# Patient Record
Sex: Male | Born: 1960 | Race: White | Hispanic: No | Marital: Married | Smoking: Former smoker
Health system: Southern US, Community
[De-identification: ages and names within clinical notes are randomized; demographics above are authoritative.]

## PROBLEM LIST (undated history)

## (undated) DIAGNOSIS — R519 Headache, unspecified: Secondary | ICD-10-CM

## (undated) HISTORY — PX: ROOT CANAL: SHX2363

## (undated) HISTORY — DX: Headache, unspecified: R51.9

## (undated) HISTORY — PX: TOOTH EXTRACTION: SUR596

---

## 1999-02-17 ENCOUNTER — Ambulatory Visit (HOSPITAL_BASED_OUTPATIENT_CLINIC_OR_DEPARTMENT_OTHER): Admission: RE | Admit: 1999-02-17 | Discharge: 1999-02-17 | Payer: Self-pay | Admitting: General Surgery

## 2018-12-12 ENCOUNTER — Other Ambulatory Visit: Payer: Self-pay

## 2018-12-12 DIAGNOSIS — Z20822 Contact with and (suspected) exposure to covid-19: Secondary | ICD-10-CM

## 2018-12-13 LAB — NOVEL CORONAVIRUS, NAA: SARS-CoV-2, NAA: NOT DETECTED

## 2018-12-17 ENCOUNTER — Telehealth: Payer: Self-pay | Admitting: General Practice

## 2018-12-17 NOTE — Telephone Encounter (Signed)
Pt aware covid lab test negative, not detected °

## 2019-10-22 DIAGNOSIS — E6609 Other obesity due to excess calories: Secondary | ICD-10-CM | POA: Diagnosis not present

## 2019-10-22 DIAGNOSIS — Z0000 Encounter for general adult medical examination without abnormal findings: Secondary | ICD-10-CM | POA: Diagnosis not present

## 2019-10-22 DIAGNOSIS — Z6832 Body mass index (BMI) 32.0-32.9, adult: Secondary | ICD-10-CM | POA: Diagnosis not present

## 2019-10-22 DIAGNOSIS — Z1389 Encounter for screening for other disorder: Secondary | ICD-10-CM | POA: Diagnosis not present

## 2019-10-22 DIAGNOSIS — Q431 Hirschsprung's disease: Secondary | ICD-10-CM | POA: Diagnosis not present

## 2019-12-03 DIAGNOSIS — R972 Elevated prostate specific antigen [PSA]: Secondary | ICD-10-CM | POA: Diagnosis not present

## 2019-12-03 DIAGNOSIS — Z125 Encounter for screening for malignant neoplasm of prostate: Secondary | ICD-10-CM | POA: Diagnosis not present

## 2019-12-03 DIAGNOSIS — N400 Enlarged prostate without lower urinary tract symptoms: Secondary | ICD-10-CM | POA: Diagnosis not present

## 2020-01-09 DIAGNOSIS — R972 Elevated prostate specific antigen [PSA]: Secondary | ICD-10-CM | POA: Diagnosis not present

## 2020-01-09 DIAGNOSIS — C61 Malignant neoplasm of prostate: Secondary | ICD-10-CM | POA: Diagnosis not present

## 2020-01-15 DIAGNOSIS — R972 Elevated prostate specific antigen [PSA]: Secondary | ICD-10-CM | POA: Diagnosis not present

## 2020-01-15 DIAGNOSIS — C61 Malignant neoplasm of prostate: Secondary | ICD-10-CM | POA: Diagnosis not present

## 2020-01-15 DIAGNOSIS — N5201 Erectile dysfunction due to arterial insufficiency: Secondary | ICD-10-CM | POA: Diagnosis not present

## 2020-01-15 DIAGNOSIS — N400 Enlarged prostate without lower urinary tract symptoms: Secondary | ICD-10-CM | POA: Diagnosis not present

## 2020-01-23 ENCOUNTER — Other Ambulatory Visit: Payer: Self-pay | Admitting: Urology

## 2020-01-23 DIAGNOSIS — C61 Malignant neoplasm of prostate: Secondary | ICD-10-CM

## 2020-02-17 ENCOUNTER — Ambulatory Visit
Admission: RE | Admit: 2020-02-17 | Discharge: 2020-02-17 | Disposition: A | Discharge status: Home / Self Care | Payer: BC Managed Care – PPO | Source: Ambulatory Visit | Attending: Urology | Admitting: Urology

## 2020-02-17 DIAGNOSIS — C61 Malignant neoplasm of prostate: Secondary | ICD-10-CM

## 2020-02-17 DIAGNOSIS — N402 Nodular prostate without lower urinary tract symptoms: Secondary | ICD-10-CM | POA: Diagnosis not present

## 2020-02-17 MED ORDER — GADOBENATE DIMEGLUMINE 529 MG/ML IV SOLN
20.0000 mL | Freq: Once | INTRAVENOUS | Status: AC | PRN
  Administered 2020-02-17: 20 mL via INTRAVENOUS

## 2020-03-06 DIAGNOSIS — Z23 Encounter for immunization: Secondary | ICD-10-CM | POA: Diagnosis not present

## 2021-01-08 DIAGNOSIS — Z23 Encounter for immunization: Secondary | ICD-10-CM | POA: Diagnosis not present

## 2021-02-17 DIAGNOSIS — E6609 Other obesity due to excess calories: Secondary | ICD-10-CM | POA: Diagnosis not present

## 2021-02-17 DIAGNOSIS — R972 Elevated prostate specific antigen [PSA]: Secondary | ICD-10-CM | POA: Diagnosis not present

## 2021-02-17 DIAGNOSIS — J3489 Other specified disorders of nose and nasal sinuses: Secondary | ICD-10-CM | POA: Diagnosis not present

## 2021-02-17 DIAGNOSIS — Z6834 Body mass index (BMI) 34.0-34.9, adult: Secondary | ICD-10-CM | POA: Diagnosis not present

## 2021-03-01 DIAGNOSIS — J342 Deviated nasal septum: Secondary | ICD-10-CM | POA: Diagnosis not present

## 2021-03-01 DIAGNOSIS — J310 Chronic rhinitis: Secondary | ICD-10-CM | POA: Diagnosis not present

## 2021-03-01 DIAGNOSIS — G501 Atypical facial pain: Secondary | ICD-10-CM | POA: Diagnosis not present

## 2021-03-01 DIAGNOSIS — J343 Hypertrophy of nasal turbinates: Secondary | ICD-10-CM | POA: Diagnosis not present

## 2021-03-02 ENCOUNTER — Other Ambulatory Visit: Payer: Self-pay | Admitting: Otolaryngology

## 2021-03-02 DIAGNOSIS — J329 Chronic sinusitis, unspecified: Secondary | ICD-10-CM

## 2021-03-11 ENCOUNTER — Other Ambulatory Visit: Payer: Self-pay

## 2021-03-11 ENCOUNTER — Ambulatory Visit
Admission: RE | Admit: 2021-03-11 | Discharge: 2021-03-11 | Disposition: A | Discharge status: Home / Self Care | Payer: BC Managed Care – PPO | Source: Ambulatory Visit | Attending: Otolaryngology | Admitting: Otolaryngology

## 2021-03-11 DIAGNOSIS — J329 Chronic sinusitis, unspecified: Secondary | ICD-10-CM | POA: Diagnosis not present

## 2021-04-14 ENCOUNTER — Encounter: Payer: Self-pay | Admitting: *Deleted

## 2021-04-15 NOTE — Progress Notes (Signed)
[facility]  PATIENT: Jeffery Curry DOB: April 26, 1960  REFERRING CLINICIAN: Raylene Miyamoto, MD HISTORY FROM: self REASON FOR VISIT: facial pain   HISTORICAL  CHIEF COMPLAINT:  Chief Complaint  Patient presents with   New Patient (Initial Visit)    Pt in room 1  pt states he is having pain in his right jaw the patient states when he talks and eat the pain increases . Pt states pain has been going on for a 1year    HISTORY OF PRESENT ILLNESS:  The patient presents for evaluation of right sided facial pain which began one year ago. Pain is predominantly in the V2-V3 distribution and is described as an electrical pain with occasional throbbing. He is pain free between episodes.  Pain is most intense when he first wakes up. It can be triggered by eating, brushing teeth, and talking. He is mostly eating soup and pudding and tries to chew only with the left side of his mouth.  Saw a dentist and ENT who did not find any dental or sinus cause of his pain. Had a maxillofacial CT which was unremarkable.  Sometimes will hear his right jaw pop at night.  FACIAL PAIN FEATURES  Side: Right Distribution: V2, V3, occasional twinge in right eye Any pain on side or back of head:  Character: throbbing, electrical Duration: seconds Pain-free between episodes: yes Triggers: talking, eating, swallowing, touching face, sucking on a straw, brushing teeth Sensory abnormalities: no Tried Tegretol/Trileptal: no Prior procedures/outcome: none History of MS, Lyme's disease, facial rash: had a bug bite on his hip but no bullseye rash History of dental/oral surgery, facial/plastic surgery: had tooth pulled and root canal on that side (after onset of pain)  Had a maxillofacial CT 03/11/21 which did not show evidence of sinus disease.  OTHER MEDICAL CONDITIONS: none   REVIEW OF SYSTEMS: Full 14 system review of systems performed and negative with exception of: facial  pain  ALLERGIES: Not on File  HOME MEDICATIONS: Outpatient Medications Prior to Visit  Medication Sig Dispense Refill   Cholecalciferol (VITAMIN D3 PO) Take by mouth daily.     Fexofenadine HCl (ALLEGRA PO) Take by mouth as needed.     fluticasone (FLONASE) 50 MCG/ACT nasal spray Place 2 sprays into both nostrils daily.     Pseudoephedrine-Ibuprofen (ADVIL COLD/SINUS PO) Take by mouth.     No facility-administered medications prior to visit.    PAST MEDICAL HISTORY: Past Medical History:  Diagnosis Date   Right sided facial pain     PAST SURGICAL HISTORY: Past Surgical History:  Procedure Laterality Date   ROOT CANAL     TOOTH EXTRACTION      FAMILY HISTORY: Family History  Problem Relation Age of Onset   Neuropathy Neg Hx     SOCIAL HISTORY: Social History   Socioeconomic History   Marital status: Married    Spouse name: Not on file   Number of children: Not on file   Years of education: Not on file   Highest education level: Not on file  Occupational History   Not on file  Tobacco Use   Smoking status: Former    Types: Cigarettes   Smokeless tobacco: Never  Vaping Use   Vaping Use: Never used  Substance and Sexual Activity   Alcohol use: Yes    Alcohol/week: 2.0 standard drinks    Types: 2 Cans of beer per week   Drug use: Not on file   Sexual activity: Not on  file  Other Topics Concern   Not on file  Social History Narrative   Not on file   Social Determinants of Health   Financial Resource Strain: Not on file  Food Insecurity: Not on file  Transportation Needs: Not on file  Physical Activity: Not on file  Stress: Not on file  Social Connections: Not on file  Intimate Partner Violence: Not on file     PHYSICAL EXAM  GENERAL EXAM/CONSTITUTIONAL: Vitals:  Vitals:   04/16/21 0913  BP: (!) 155/81  Pulse: 63  Weight: 233 lb 3.2 oz (105.8 kg)  Height: 5\' 11"  (1.803 m)   Body mass index is 32.52 kg/m. Wt Readings from Last 3  Encounters:  04/16/21 233 lb 3.2 oz (105.8 kg)   Patient is in no distress; well developed, nourished and groomed; neck is supple  CARDIOVASCULAR: Examination of peripheral vascular system by observation and palpation is normal  EYES: Pupils round and reactive to light, Visual fields full to confrontation, Extraocular movements intact  MUSCULOSKELETAL: Gait, strength, tone, movements noted in Neurologic exam below  NEUROLOGIC: MENTAL STATUS:  awake, alert, oriented to person, place and time  CRANIAL NERVE:  2nd, 3rd, 4th, 6th - pupils equal and reactive to light, visual fields full to confrontation, extraocular muscles intact, no nystagmus 5th - facial sensation symmetric 7th - facial strength symmetric 8th - hearing intact 9th - palate elevates symmetrically, uvula midline 11th - shoulder shrug symmetric 12th - tongue protrusion midline  MOTOR:  normal bulk and tone, full strength in the BUE, BLE  SENSORY:  normal and symmetric to light touch all 4 extremities  COORDINATION:  finger-nose-finger intact bilaterally  REFLEXES:  deep tendon reflexes present and symmetric  GAIT/STATION:  normal     DIAGNOSTIC DATA (LABS, IMAGING, TESTING) - I reviewed patient records, labs, notes, testing and imaging myself where available.  CT maxillofacial 03/11/21:  No evidence of acute or chronic sinus inflammatory disease. No abnormality seen to explain the presenting symptoms. Patient does have unerupted wisdom teeth at the right maxilla and both sides of the mandible, but without apparent complicating feature.    ASSESSMENT AND PLAN  61 y.o. year old male who presents for evaluation of right sided facial pain. His clinical picture is consistent with right trigeminal neuralgia. Will order MRI/MRA to assess for structural causes including vascular compression of the trigeminal nerve. Will start oxcarbazepine for prevention and baclofen as needed for breakthrough  pain.   1. Trigeminal neuralgia       PLAN: -MRI/MRA brain -Start Oxcarbazepine 300 mg BID -Start Baclofen 5-10 mg TID PRN -CBC, CMP today and 3 months from now  Orders Placed This Encounter  Procedures   MR BRAIN W WO CONTRAST   MR ANGIO HEAD WO CONTRAST   CBC with Differential/Platelets   CMP    Meds ordered this encounter  Medications   Oxcarbazepine (TRILEPTAL) 300 MG tablet    Sig: Take 1 tablet (300 mg total) by mouth 2 (two) times daily.    Dispense:  60 tablet    Refill:  3   baclofen (LIORESAL) 10 MG tablet    Sig: Take 1/2 to 1 tablet up to three times a day as needed for trigeminal neuralgia pain flares    Dispense:  30 each    Refill:  3    Return in about 3 months (around 07/15/2021).    Genia Harold, MD 04/16/21 9:52 AM  I spent an average of 34 minutes chart reviewing and counseling  the patient, with at least 50% of the time face to face with the patient.   [facility] [address], [place], [place] [address] [phone]

## 2021-04-16 ENCOUNTER — Ambulatory Visit: Payer: BC Managed Care – PPO | Admitting: Psychiatry

## 2021-04-16 ENCOUNTER — Encounter: Payer: Self-pay | Admitting: Psychiatry

## 2021-04-16 VITALS — BP 155/81 | HR 63 | Ht 71.0 in | Wt 233.2 lb

## 2021-04-16 DIAGNOSIS — G500 Trigeminal neuralgia: Secondary | ICD-10-CM | POA: Diagnosis not present

## 2021-04-16 MED ORDER — BACLOFEN 10 MG PO TABS: ORAL_TABLET | ORAL | 3 refills | Status: DC

## 2021-04-16 MED ORDER — OXCARBAZEPINE 300 MG PO TABS: 300.0000 mg | ORAL_TABLET | Freq: Two times a day (BID) | ORAL | 3 refills | Status: DC

## 2021-04-16 NOTE — Patient Instructions (Addendum)
MRI and MRA look at brain and blood vessels Start oxcarbazepine 300 mg twice a day Start baclofen 1/2 to 1 tablet up to three times as needed for pain flares Blood work today and 3 months ago  Trigeminal neuralgia  What is trigeminal neuralgia? Trigeminal neuralgia (TN) is a condition that causes sudden and severe pain in parts of the face. TN is caused by a problem with the trigeminal nerve, which is a nerve that runs from the brain to the face. What are the symptoms of trigeminal neuralgia? TN causes attacks of sharp and stabbing pain in the cheek, lower face, or around the eye. The pain lasts a few seconds to a few minutes, and usually happens on only one side of the face. The attacks can happen over and over again. Often, certain movements or activities make the pain attacks happen. These can include: ?Touching the face ?Chewing ?Talking ?Brushing the teeth ?Smiling or frowning ?Cold air on the face TN can also cause muscle spasms in the face, along with pain.  Will I need tests? They might do tests to get more information about your TN or what's causing it. These tests can include an MRI or CT scan of your brain. These are imaging tests that can create pictures of your brain.  How is trigeminal neuralgia treated? TN is usually treated with medicine. Doctors can use different types of medicines to treat TN including carbamazepine, oxcarbazepine, and gabapentin. These medicines quiet the nerve signals that cause pain in TN.  For most people, the medicine helps reduce the number of TN attacks they have and makes their pain less severe. But if medicines don't help enough or cause too many side effects, your doctor might talk with you about other treatment options. These include different types of surgical procedures that quiet the nerve and make it less likely to fire. These surgical treatments might help with symptoms, but side effects sometimes happen, including numbness or pain in the  face.

## 2021-04-17 LAB — COMPREHENSIVE METABOLIC PANEL
ALT: 19 IU/L (ref 0–44)
AST: 18 IU/L (ref 0–40)
Albumin/Globulin Ratio: 2.2 (ref 1.2–2.2)
Albumin: 4.8 g/dL (ref 3.8–4.8)
Alkaline Phosphatase: 73 IU/L (ref 44–121)
BUN/Creatinine Ratio: 12 (ref 10–24)
BUN: 12 mg/dL (ref 8–27)
Bilirubin Total: 0.9 mg/dL (ref 0.0–1.2)
CO2: 25 mmol/L (ref 20–29)
Calcium: 9.6 mg/dL (ref 8.6–10.2)
Chloride: 101 mmol/L (ref 96–106)
Creatinine, Ser: 0.98 mg/dL (ref 0.76–1.27)
Globulin, Total: 2.2 g/dL (ref 1.5–4.5)
Glucose: 108 mg/dL — ABNORMAL HIGH (ref 70–99)
Potassium: 5.1 mmol/L (ref 3.5–5.2)
Sodium: 141 mmol/L (ref 134–144)
Total Protein: 7 g/dL (ref 6.0–8.5)
eGFR: 88 mL/min/{1.73_m2} (ref >59)

## 2021-04-17 LAB — CBC WITH DIFFERENTIAL/PLATELET
Basophils Absolute: 0.1 10*3/uL (ref 0.0–0.2)
Basos: 1 %
EOS (ABSOLUTE): 0.1 10*3/uL (ref 0.0–0.4)
Eos: 1 %
Hematocrit: 44.2 % (ref 37.5–51.0)
Hemoglobin: 14.7 g/dL (ref 13.0–17.7)
Immature Grans (Abs): 0 10*3/uL (ref 0.0–0.1)
Immature Granulocytes: 0 %
Lymphocytes Absolute: 1.3 10*3/uL (ref 0.7–3.1)
Lymphs: 18 %
MCH: 28 pg (ref 26.6–33.0)
MCHC: 33.3 g/dL (ref 31.5–35.7)
MCV: 84 fL (ref 79–97)
Monocytes Absolute: 0.5 10*3/uL (ref 0.1–0.9)
Monocytes: 7 %
Neutrophils Absolute: 5.2 10*3/uL (ref 1.4–7.0)
Neutrophils: 73 %
Platelets: 263 10*3/uL (ref 150–450)
RBC: 5.25 x10E6/uL (ref 4.14–5.80)
RDW: 12.8 % (ref 11.6–15.4)
WBC: 7.2 10*3/uL (ref 3.4–10.8)

## 2021-04-20 ENCOUNTER — Telehealth: Payer: Self-pay | Admitting: Psychiatry

## 2021-04-20 NOTE — Telephone Encounter (Signed)
LVM for pt to call back to schedule   Dillon Bjork: [phone] (exp. 04/19/21 to 05/18/21)

## 2021-04-20 NOTE — Telephone Encounter (Signed)
Patient returned my call he is scheduled at [facility] for 04/21/21.

## 2021-04-21 ENCOUNTER — Ambulatory Visit: Payer: BC Managed Care – PPO | Admitting: Psychiatry

## 2021-04-21 ENCOUNTER — Ambulatory Visit: Payer: BC Managed Care – PPO

## 2021-04-21 DIAGNOSIS — G500 Trigeminal neuralgia: Secondary | ICD-10-CM

## 2021-04-21 MED ORDER — GADOBENATE DIMEGLUMINE 529 MG/ML IV SOLN
20.0000 mL | Freq: Once | INTRAVENOUS | Status: AC | PRN
  Administered 2021-04-21: 10:00:00 20 mL via INTRAVENOUS

## 2021-07-15 ENCOUNTER — Ambulatory Visit: Payer: BC Managed Care – PPO | Admitting: Psychiatry

## 2021-07-15 ENCOUNTER — Telehealth: Payer: Self-pay | Admitting: Psychiatry

## 2021-07-15 NOTE — Telephone Encounter (Signed)
Pt request refill for Oxcarbazepine (TRILEPTAL) 300 MG tablet  at [facility] #[id number] ? ?Pt only have 3 tablets left. ?Pt rescheduled on 09/01/21 at 9am ?

## 2021-07-15 NOTE — Telephone Encounter (Signed)
Contacted pharmacy to verify pt has one refill left, he does and it is ready for pick up.  ? ?Contacted pt and informed him it was ready for him to pick up, he was appreciative.  ?

## 2021-08-10 ENCOUNTER — Other Ambulatory Visit: Payer: Self-pay | Admitting: Psychiatry

## 2021-09-01 ENCOUNTER — Ambulatory Visit: Payer: BC Managed Care – PPO | Admitting: Psychiatry

## 2021-09-01 ENCOUNTER — Encounter: Payer: Self-pay | Admitting: Psychiatry

## 2021-09-01 VITALS — BP 161/85 | HR 57 | Ht 71.0 in | Wt 235.0 lb

## 2021-09-01 DIAGNOSIS — G500 Trigeminal neuralgia: Secondary | ICD-10-CM

## 2021-09-01 DIAGNOSIS — Z5181 Encounter for therapeutic drug level monitoring: Secondary | ICD-10-CM

## 2021-09-01 MED ORDER — OXCARBAZEPINE 600 MG PO TABS
600.0000 mg | ORAL_TABLET | Freq: Two times a day (BID) | ORAL | 6 refills | Status: DC
Start: 2021-09-01 — End: 2021-12-09

## 2021-09-01 MED ORDER — BACLOFEN 10 MG PO TABS: ORAL_TABLET | ORAL | 6 refills | Status: DC

## 2021-09-01 NOTE — Progress Notes (Signed)
   CC:  facial pain  Follow-up Visit  Last visit: 04/16/21  Brief HPI: 61 year old male who follows in clinic for right trigeminal neuralgia.  At his last visit he was started on oxcarbazepine 300 mg BID and baclofen 5-10 mg TID PRN. MRI/MRA were ordered.  Interval History: Oxcarbazepine initially resolved his pain, but it has started to worsen again. He is now struggling to eat again because biting down will trigger the pain. He has started taking an extra dose of oxcarbazepine in the afternoon which has helped a little bit.  Baclofen helps a little with breakthrough pain but does not resolve it. It does not make him sleepy  MRI and MRA brain 04/21/21 were normal without evidence of vascular compression of the trigeminal nerve.  Current Regimen: oxcarbazepine 300 mg TID Baclofen 5-10 mg PRN  Prior Therapies                                  oxcarbazepine 300 mg TID Baclofen 5-10 mg PRN  Physical Exam:   Vital Signs: BP (!) 161/85   Pulse (!) 57   Ht '5\' 11"'$  (1.803 m)   Wt 235 lb (106.6 kg)   BMI 32.78 kg/m  GENERAL:  well appearing, in no acute distress, alert  SKIN:  Color, texture, turgor normal. No rashes or lesions HEAD:  Normocephalic/atraumatic. RESP: normal respiratory effort MSK:  No gross joint deformities.   NEUROLOGICAL: Mental Status: Alert, oriented to person, place and time, Follows commands, and Speech fluent and appropriate. Cranial Nerves: PERRL, face symmetric, no dysarthria, hearing grossly intact Motor: moves all extremities equally Gait: normal-based.  IMPRESSION: 61 year old male who follows in clinic for right sided trigeminal neuralgia. He initially had relief with oxcarbazepine but pain has started to return. Will increase doses of oxcarbazepine and baclofen today.   PLAN: -Blood work: CBC, CMP -Increase oxcarbazepine to 600 mg BID -Increase baclofen to 1.5 mg TID PRN   Follow-up: 5 months  I spent a total of 23 minutes on the date of  the service.  Discussed medication side effects, adverse reactions and drug interactions. Written educational materials and patient instructions outlining all of the above were given.  Genia Harold, MD 09/01/21 9:29 AM

## 2021-09-01 NOTE — Patient Instructions (Signed)
Increase oxcarbazepine to 600 mg twice a day Increase baclofen to 1.5 pills as needed for facial pain flares Routine blood work today

## 2021-09-02 LAB — COMPREHENSIVE METABOLIC PANEL
ALT: 19 IU/L (ref 0–44)
AST: 19 IU/L (ref 0–40)
Albumin/Globulin Ratio: 2.1 (ref 1.2–2.2)
Albumin: 4.6 g/dL (ref 3.8–4.8)
Alkaline Phosphatase: 84 IU/L (ref 44–121)
BUN/Creatinine Ratio: 11 (ref 10–24)
BUN: 10 mg/dL (ref 8–27)
Bilirubin Total: 0.5 mg/dL (ref 0.0–1.2)
CO2: 25 mmol/L (ref 20–29)
Calcium: 9.2 mg/dL (ref 8.6–10.2)
Chloride: 103 mmol/L (ref 96–106)
Creatinine, Ser: 0.93 mg/dL (ref 0.76–1.27)
Globulin, Total: 2.2 g/dL (ref 1.5–4.5)
Glucose: 108 mg/dL — ABNORMAL HIGH (ref 70–99)
Potassium: 4.8 mmol/L (ref 3.5–5.2)
Sodium: 139 mmol/L (ref 134–144)
Total Protein: 6.8 g/dL (ref 6.0–8.5)
eGFR: 93 mL/min/{1.73_m2} (ref >59)

## 2021-09-02 LAB — CBC WITH DIFFERENTIAL/PLATELET
Basophils Absolute: 0.1 10*3/uL (ref 0.0–0.2)
Basos: 2 %
EOS (ABSOLUTE): 0.1 10*3/uL (ref 0.0–0.4)
Eos: 2 %
Hematocrit: 43.9 % (ref 37.5–51.0)
Hemoglobin: 14.5 g/dL (ref 13.0–17.7)
Immature Grans (Abs): 0 10*3/uL (ref 0.0–0.1)
Immature Granulocytes: 0 %
Lymphocytes Absolute: 1.3 10*3/uL (ref 0.7–3.1)
Lymphs: 28 %
MCH: 28 pg (ref 26.6–33.0)
MCHC: 33 g/dL (ref 31.5–35.7)
MCV: 85 fL (ref 79–97)
Monocytes Absolute: 0.5 10*3/uL (ref 0.1–0.9)
Monocytes: 10 %
Neutrophils Absolute: 2.7 10*3/uL (ref 1.4–7.0)
Neutrophils: 58 %
Platelets: 233 10*3/uL (ref 150–450)
RBC: 5.18 x10E6/uL (ref 4.14–5.80)
RDW: 13.7 % (ref 11.6–15.4)
WBC: 4.6 10*3/uL (ref 3.4–10.8)

## 2021-10-13 ENCOUNTER — Telehealth: Payer: Self-pay | Admitting: Psychiatry

## 2021-10-13 NOTE — Telephone Encounter (Signed)
Spoke with patient who stated yesterday, late afternoon he had spasms, couldn't eat or talk. He   took trileptal 600 mg, 2 baclofen which helped  resolve everything after an hour. He noted  Right eye floaters last Fri, peripheral vision in right eye had flashes. He does see eye dr regularly.  I advised he my need to call eye dr to report vision issues.  Today he feels good, ate this morning without difficulty. I advised will send message to Dr Billey Gosling, call him with any instructions, information. Patient verbalized understanding, appreciation.

## 2021-10-13 NOTE — Telephone Encounter (Signed)
Pt has called to report that on yesterday he had an episode where he was unable to talk or eat.  Pt states there are vision issues as well, please call.

## 2021-10-13 NOTE — Telephone Encounter (Signed)
Does he feel his pain is relatively well controlled with the trileptal? If not he can try increasing his nighttime dose to 900 mg (1.5 tablets). Vision issues are not common with trigeminal neuralgia so probably aren't related. I'd recommend he follow up with the eye doctor for the floaters

## 2021-10-13 NOTE — Telephone Encounter (Signed)
Called patient and reviewed Dr Georgina Peer message. He stated that overall his pain has been well controlled. The last occurrence of pain was unexpected but improved within 1 hour after taking trileptal and 2 baclofen. He will wait to increase night dose only if needed. He will call his eye dr to FU on eye issues. Patient verbalized understanding, appreciation.

## 2021-12-09 ENCOUNTER — Telehealth: Payer: Self-pay | Admitting: Psychiatry

## 2021-12-09 DIAGNOSIS — Z5181 Encounter for therapeutic drug level monitoring: Secondary | ICD-10-CM

## 2021-12-09 MED ORDER — OXCARBAZEPINE 300 MG PO TABS: 900.0000 mg | ORAL_TABLET | Freq: Two times a day (BID) | ORAL | 6 refills | Status: DC

## 2021-12-09 NOTE — Telephone Encounter (Signed)
Called patient who stated he's taking oxcarbazepine 300 mg in morning, 150 mg at lunch, 300 mg in evening. Baclofen not very helpful. I advised him Dr Billey Gosling sent in new Rx 09/01/21 to take 600 mg twice daily. He stated the bottle he has at work has date 09/08/21, take 300 mg twice daily. He is unsure what strength is in bottle at home but stated the pills are twice as big as the first ones he got. I advised I'll call pharmacy and call him back. Patient verbalized understanding, appreciation. [facility], spoke with Janett Billow who stated patient picked up 600 mg on 12/01/21. I Asked why 300 mg was filled on 09/08/21; she stated the Rx was never discontinued. I advised her the MD did d/c on 09/01/21. They never received d/c on their end. She will stop all refills on 300 mg tabs.  Called patient and advised him of above conversation. He then stated he talked to his wife just now who wanted him to mention that she noticed he seems confused in past 6 months. He hasn't noted that. Re: medication, on further discussion he stated he takes pills from bottle at home and puts in bottle at work. So he has been taking 600 mg in am , pm and 300 mg at lunch. I advised will send to Dr Billey Gosling, call him with her recommendations.

## 2021-12-09 NOTE — Telephone Encounter (Signed)
He can start taking 900 mg twice a day, which should be a 300 mg increase from what he is currently taking. I'll send in a new rx. I will also order some blood work to check his electrolyte levels and see if this could be causing confusion

## 2021-12-09 NOTE — Telephone Encounter (Signed)
Pt has called to report that the medication prescribed by Dr Billey Gosling is not working, he would like a call to discuss.oxcarbazepine (TRILEPTAL) 600 MG tablet, it helped at first, but no longer.

## 2021-12-09 NOTE — Addendum Note (Signed)
Addended by: Genia Harold on: 12/09/2021 12:43 PM   Modules accepted: Orders

## 2021-12-09 NOTE — Telephone Encounter (Signed)
Called patient and reviewed Dr Georgina Peer new Rx and directions for use and labs ordered to check for possible causes of confusion. He stated he will come [facility] for labs, repeated Rx directions correctly,  verbalized understanding, appreciation.

## 2021-12-13 ENCOUNTER — Other Ambulatory Visit (INDEPENDENT_AMBULATORY_CARE_PROVIDER_SITE_OTHER): Payer: Self-pay

## 2021-12-13 DIAGNOSIS — Z5181 Encounter for therapeutic drug level monitoring: Secondary | ICD-10-CM | POA: Diagnosis not present

## 2021-12-13 DIAGNOSIS — Z0289 Encounter for other administrative examinations: Secondary | ICD-10-CM

## 2021-12-14 LAB — CBC WITH DIFFERENTIAL/PLATELET
Basophils Absolute: 0.1 10*3/uL (ref 0.0–0.2)
Basos: 1 %
EOS (ABSOLUTE): 0.1 10*3/uL (ref 0.0–0.4)
Eos: 2 %
Hematocrit: 44.3 % (ref 37.5–51.0)
Hemoglobin: 14.5 g/dL (ref 13.0–17.7)
Immature Grans (Abs): 0 10*3/uL (ref 0.0–0.1)
Immature Granulocytes: 0 %
Lymphocytes Absolute: 1.3 10*3/uL (ref 0.7–3.1)
Lymphs: 22 %
MCH: 27.8 pg (ref 26.6–33.0)
MCHC: 32.7 g/dL (ref 31.5–35.7)
MCV: 85 fL (ref 79–97)
Monocytes Absolute: 0.5 10*3/uL (ref 0.1–0.9)
Monocytes: 8 %
Neutrophils Absolute: 4 10*3/uL (ref 1.4–7.0)
Neutrophils: 67 %
Platelets: 256 10*3/uL (ref 150–450)
RBC: 5.22 x10E6/uL (ref 4.14–5.80)
RDW: 12.7 % (ref 11.6–15.4)
WBC: 6 10*3/uL (ref 3.4–10.8)

## 2021-12-14 LAB — COMPREHENSIVE METABOLIC PANEL
ALT: 23 IU/L (ref 0–44)
AST: 16 IU/L (ref 0–40)
Albumin/Globulin Ratio: 1.9 (ref 1.2–2.2)
Albumin: 4.4 g/dL (ref 3.9–4.9)
Alkaline Phosphatase: 87 IU/L (ref 44–121)
BUN/Creatinine Ratio: 9 — ABNORMAL LOW (ref 10–24)
BUN: 9 mg/dL (ref 8–27)
Bilirubin Total: 0.3 mg/dL (ref 0.0–1.2)
CO2: 27 mmol/L (ref 20–29)
Calcium: 9.3 mg/dL (ref 8.6–10.2)
Chloride: 100 mmol/L (ref 96–106)
Creatinine, Ser: 1.01 mg/dL (ref 0.76–1.27)
Globulin, Total: 2.3 g/dL (ref 1.5–4.5)
Glucose: 104 mg/dL — ABNORMAL HIGH (ref 70–99)
Potassium: 5.1 mmol/L (ref 3.5–5.2)
Sodium: 139 mmol/L (ref 134–144)
Total Protein: 6.7 g/dL (ref 6.0–8.5)
eGFR: 85 mL/min/{1.73_m2} (ref >59)

## 2021-12-22 ENCOUNTER — Encounter: Payer: Self-pay | Admitting: Psychiatry

## 2022-01-25 DIAGNOSIS — Z1331 Encounter for screening for depression: Secondary | ICD-10-CM | POA: Diagnosis not present

## 2022-01-25 DIAGNOSIS — E6609 Other obesity due to excess calories: Secondary | ICD-10-CM | POA: Diagnosis not present

## 2022-01-25 DIAGNOSIS — E119 Type 2 diabetes mellitus without complications: Secondary | ICD-10-CM | POA: Diagnosis not present

## 2022-01-25 DIAGNOSIS — Z6832 Body mass index (BMI) 32.0-32.9, adult: Secondary | ICD-10-CM | POA: Diagnosis not present

## 2022-01-25 DIAGNOSIS — R7309 Other abnormal glucose: Secondary | ICD-10-CM | POA: Diagnosis not present

## 2022-01-25 DIAGNOSIS — S0430XA Injury of trigeminal nerve, unspecified side, initial encounter: Secondary | ICD-10-CM | POA: Diagnosis not present

## 2022-01-25 DIAGNOSIS — E785 Hyperlipidemia, unspecified: Secondary | ICD-10-CM | POA: Diagnosis not present

## 2022-01-25 DIAGNOSIS — Z0001 Encounter for general adult medical examination with abnormal findings: Secondary | ICD-10-CM | POA: Diagnosis not present

## 2022-01-25 DIAGNOSIS — R972 Elevated prostate specific antigen [PSA]: Secondary | ICD-10-CM | POA: Diagnosis not present

## 2022-02-23 DIAGNOSIS — N5201 Erectile dysfunction due to arterial insufficiency: Secondary | ICD-10-CM | POA: Diagnosis not present

## 2022-02-23 DIAGNOSIS — C61 Malignant neoplasm of prostate: Secondary | ICD-10-CM | POA: Diagnosis not present

## 2022-02-23 NOTE — Progress Notes (Unsigned)
   CC:  trigeminal neuralgia  Follow-up Visit  Last visit: 09/01/21  Brief HPI: 61 year old male who follows in clinic for right sided trigeminal neuralgia. MRI/MRA brain 04/21/21 were normal without evidence of vascular compression of the trigeminal nerve.  At his last visit, oxcarbazepine was increased to 600 mg BID and baclofen was increased to 15 mg TID PRN.  Interval History: He started to develop breakthrough pain in September and his dose of oxcarbazepine was increased to 900 mg BID. Taking 900 mg BID gave him cognitive side effects so he has been spacing the dose out. He is currently taking this as 600 mg in the AM, 300 mg at 10 AM, 300 mg at 12:30 pm, 300 mg at 5 mg, and 300 mg at bedtime. This does help his facial pain as long as he spaces the dose out. Wife still is concerned about cognitive changes on the medication. Still will sometimes have breakthrough pain when he brushes his teeth or touches his face. Has not been taking baclofen as he has not found it helpful.  Prior Therapies                                  oxcarbazepine 600 mg BID Baclofen 15 mg PRN  Physical Exam:   Vital Signs: BP (!) 166/82   Pulse (!) 56   Ht '5\' 10"'$  (1.778 m)   Wt 233 lb (105.7 kg)   BMI 33.43 kg/m  GENERAL:  well appearing, in no acute distress, alert  SKIN:  Color, texture, turgor normal. No rashes or lesions HEAD:  Normocephalic/atraumatic. RESP: normal respiratory effort MSK:  No gross joint deformities.   NEUROLOGICAL: Mental Status: Alert, oriented to person, place and time, Follows commands, and Speech fluent and appropriate. Cranial Nerves: PERRL, face symmetric, no dysarthria, hearing grossly intact Motor: moves all extremities equally Gait: normal-based.  IMPRESSION: 61 year old male who presents for follow up of right sided trigeminal neuralgia. He has had improvement with oxcarbazepine, however he does note cognitive changes and breakthrough pain at the maximum dose. He would  prefer not to try an alternative medication due to concerns for cognitive changes. He would like to see if he could receive Botox for his TN. Will attempt to get this approved through his insurance. Discussed referral to [facility] for refractory TN, which he declined at this time.  PLAN: -Continue oxcarbazepine 1800 mg daily for now -Will attempt to get Botox approval through his insurance   Follow-up: 6 months  I spent a total of 24 minutes on the date of the service. Discussed medication side effects, adverse reactions and drug interactions. Written educational materials and patient instructions outlining all of the above were given.  Genia Harold, MD 02/24/22 9:22 AM

## 2022-02-24 ENCOUNTER — Encounter: Payer: Self-pay | Admitting: Psychiatry

## 2022-02-24 ENCOUNTER — Telehealth: Payer: Self-pay | Admitting: Neurology

## 2022-02-24 ENCOUNTER — Ambulatory Visit: Payer: BC Managed Care – PPO | Admitting: Psychiatry

## 2022-02-24 VITALS — BP 166/82 | HR 56 | Ht 70.0 in | Wt 233.0 lb

## 2022-02-24 DIAGNOSIS — G500 Trigeminal neuralgia: Secondary | ICD-10-CM

## 2022-02-24 NOTE — Telephone Encounter (Signed)
Pt is wanting to see if insurance will cover botox for the trigiminal neuralgia  J [phone] [id number] ICD code: G50 and G51.8

## 2022-02-25 ENCOUNTER — Other Ambulatory Visit (HOSPITAL_COMMUNITY): Payer: Self-pay

## 2022-02-25 NOTE — Telephone Encounter (Signed)
Patient Advocate Encounter   Received notification that prior authorization for Botox 100UNIT solution is required.   PA submitted on 02/25/2022 Key [id number] Status is pending       Lyndel Safe, [place] Patient Advocate Specialist [place] Patient Advocate Team Direct Number: [phone]  Fax: [phone]

## 2022-03-02 NOTE — Telephone Encounter (Signed)
Patient Advocate Encounter  Received notification that the request for prior authorization for Botox 100UNIT solution has been denied due to Trigeminal neuralgia is not one of the conditions that is covered for Botox.       Lyndel Safe, [place] Patient Advocate Specialist [place] Patient Advocate Team Direct Number: [phone]  Fax: [phone]

## 2022-03-23 DIAGNOSIS — C61 Malignant neoplasm of prostate: Secondary | ICD-10-CM | POA: Diagnosis not present

## 2022-03-28 HISTORY — PX: COLONOSCOPY: SHX174

## 2022-03-30 DIAGNOSIS — C61 Malignant neoplasm of prostate: Secondary | ICD-10-CM | POA: Diagnosis not present

## 2022-03-30 DIAGNOSIS — N5201 Erectile dysfunction due to arterial insufficiency: Secondary | ICD-10-CM | POA: Diagnosis not present

## 2022-04-07 ENCOUNTER — Encounter (INDEPENDENT_AMBULATORY_CARE_PROVIDER_SITE_OTHER): Payer: BC Managed Care – PPO | Admitting: Psychiatry

## 2022-04-07 DIAGNOSIS — G500 Trigeminal neuralgia: Secondary | ICD-10-CM

## 2022-04-12 MED ORDER — GABAPENTIN 300 MG PO CAPS: ORAL_CAPSULE | ORAL | 0 refills | Status: DC

## 2022-04-12 MED ORDER — GABAPENTIN 300 MG PO CAPS: 300.0000 mg | ORAL_CAPSULE | Freq: Three times a day (TID) | ORAL | 5 refills | Status: DC

## 2022-04-12 NOTE — Telephone Encounter (Signed)

## 2022-05-08 ENCOUNTER — Other Ambulatory Visit: Payer: Self-pay | Admitting: Psychiatry

## 2022-05-12 ENCOUNTER — Other Ambulatory Visit: Payer: Self-pay | Admitting: Psychiatry

## 2022-05-19 DIAGNOSIS — Z1211 Encounter for screening for malignant neoplasm of colon: Secondary | ICD-10-CM | POA: Diagnosis not present

## 2022-05-19 DIAGNOSIS — K648 Other hemorrhoids: Secondary | ICD-10-CM | POA: Diagnosis not present

## 2022-05-19 DIAGNOSIS — K6289 Other specified diseases of anus and rectum: Secondary | ICD-10-CM | POA: Diagnosis not present

## 2022-05-19 DIAGNOSIS — Z980 Intestinal bypass and anastomosis status: Secondary | ICD-10-CM | POA: Diagnosis not present

## 2022-05-19 DIAGNOSIS — K635 Polyp of colon: Secondary | ICD-10-CM | POA: Diagnosis not present

## 2022-05-27 IMAGING — CT CT MAXILLOFACIAL W/O CM
1 series · 15 of 30 positions shown, 19 images · non-contrast
Comparison: None.

CLINICAL DATA: Chronic sinusitis. Right jaw and maxillary pain over
the last year.

EXAM:
CT MAXILLOFACIAL WITHOUT CONTRAST
TECHNIQUE: Multidetector CT images of the paranasal sinuses were obtained using
the standard protocol without intravenous contrast.

[Series 4: soft tissue · axial · 0.41mm/px · z∈[+1052,+1214]mm · 15 of 174 slices shown, 19 images]
[im 6/174  brain]
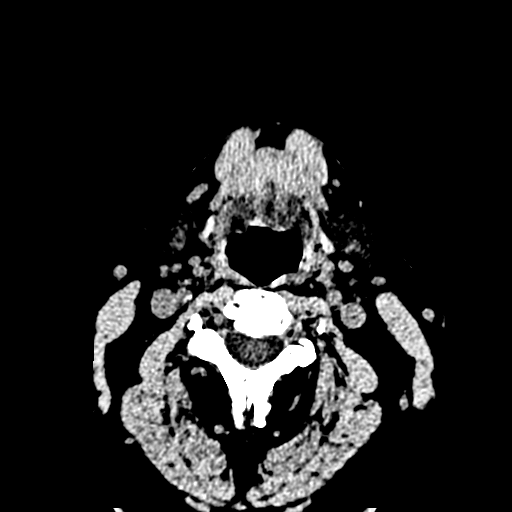
[im 6/174  bone]
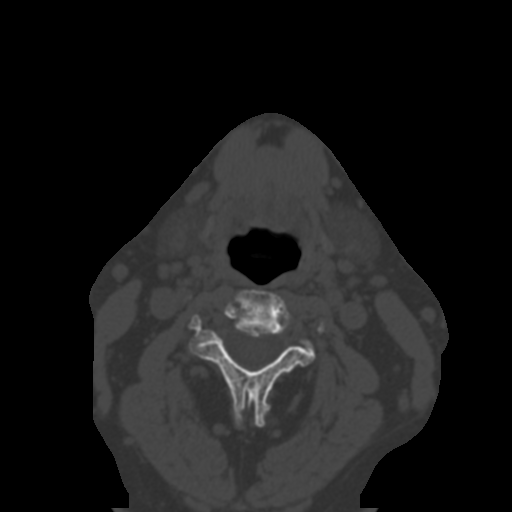
[im 18/174  bone]
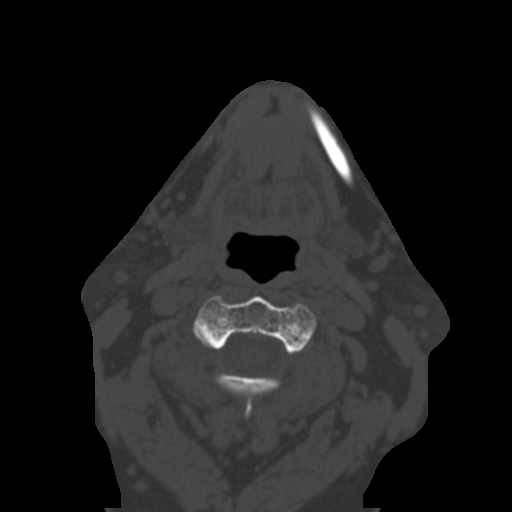
[im 30/174  bone]
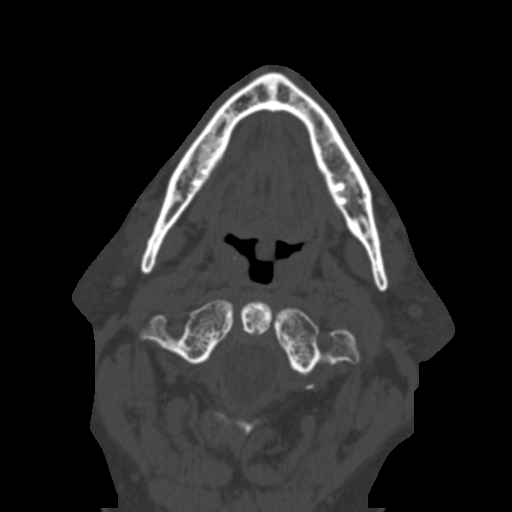
[im 42/174  bone]
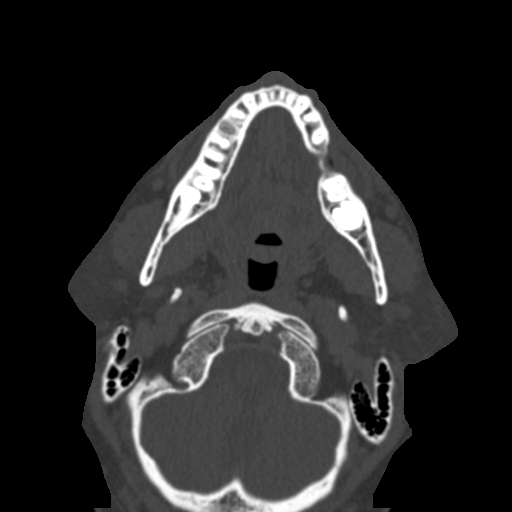
[im 54/174  brain]
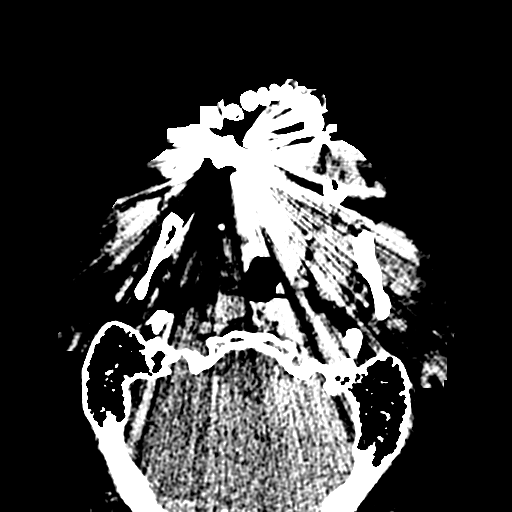
[im 54/174  bone]
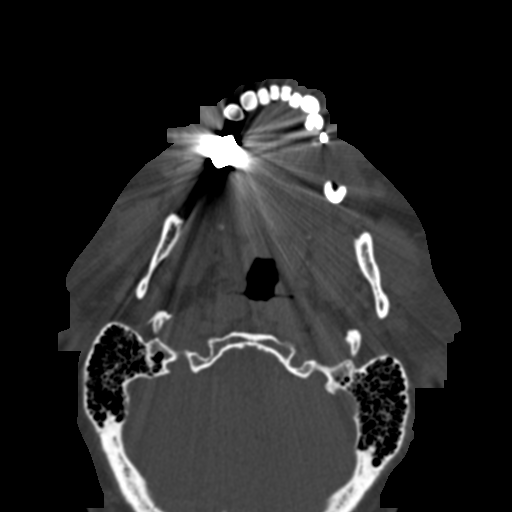
[im 66/174  bone]
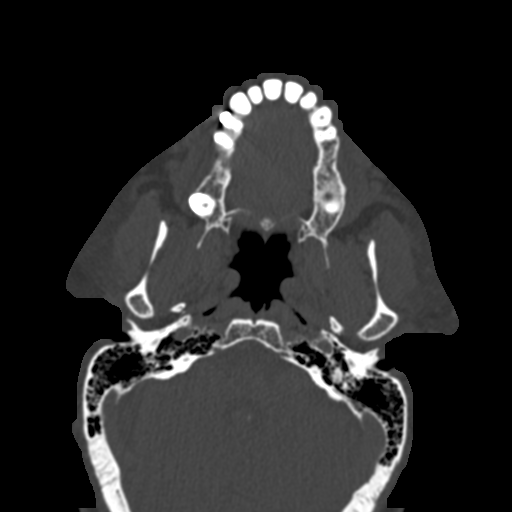
[im 78/174  bone]
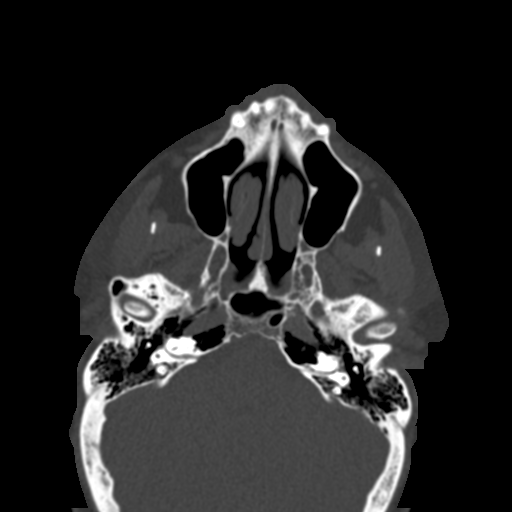
[im 90/174  bone]
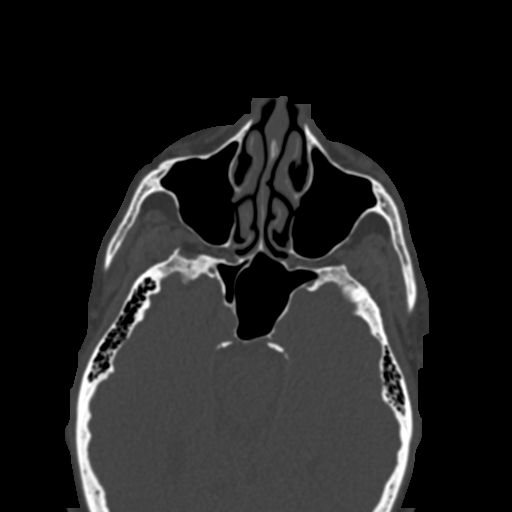
[im 96/174  brain]
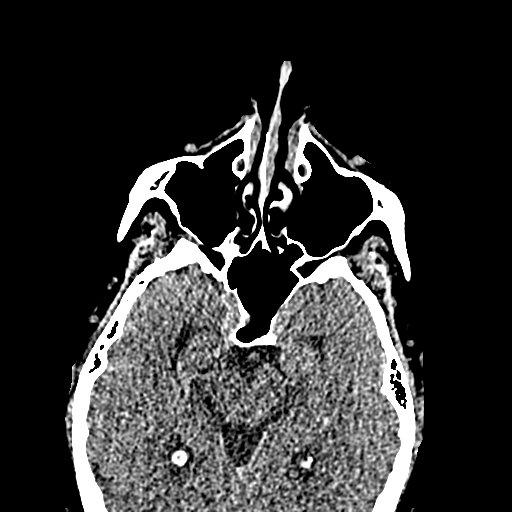
[im 96/174  bone]
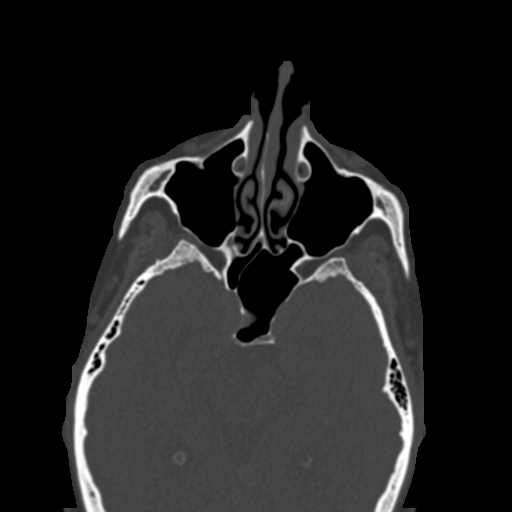
[im 108/174  bone]
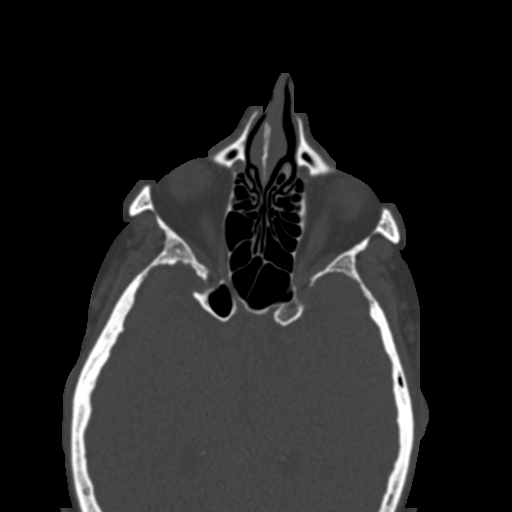
[im 120/174  bone]
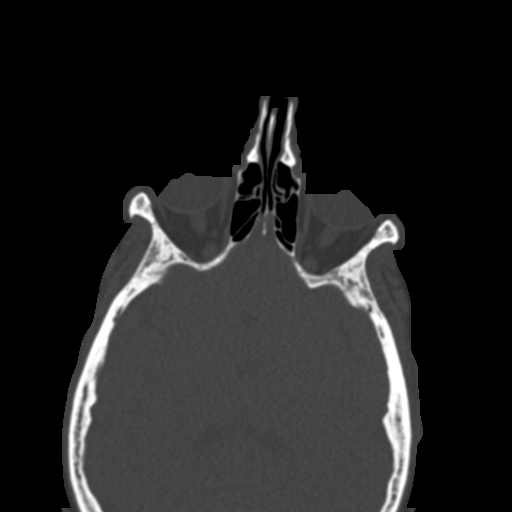
[im 132/174  bone]
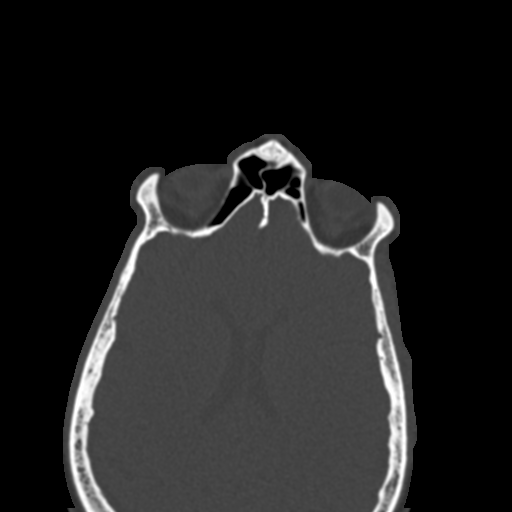
[im 144/174  brain]
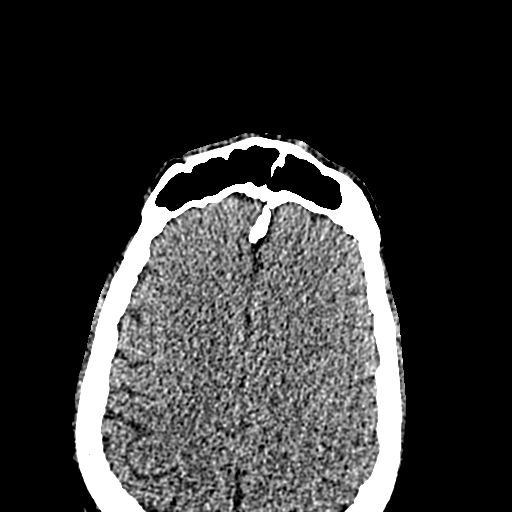
[im 144/174  bone]
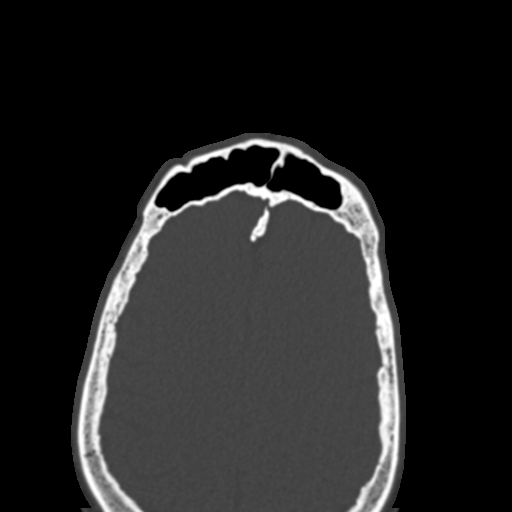
[im 156/174  bone]
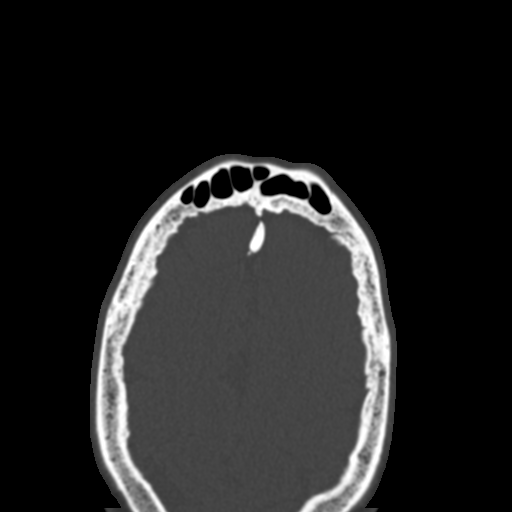
[im 168/174  bone]
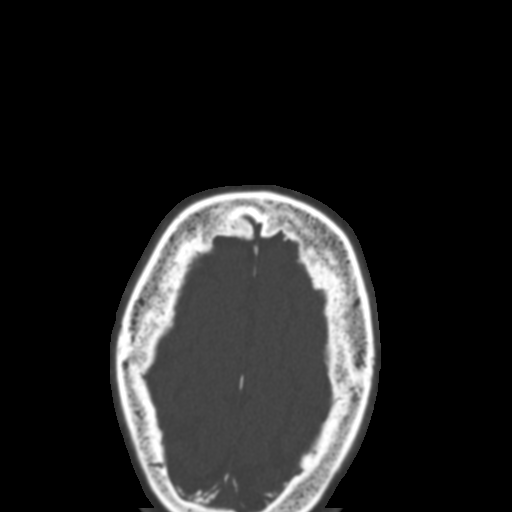

[15 of 30 positions shown; findings below may reference images not displayed]

FINDINGS: Paranasal sinuses:

Frontal: Large pneumatized sinuses. Frontal sinus osteoma
incidentally noted on the right.

Ethmoid: Normally aerated.

Maxillary: Normally aerated.

Sphenoid: Normally aerated. Patent sphenoethmoidal recesses.

Right ostiomeatal unit: Patent.

Left ostiomeatal unit: Patent.

Nasal passages: Patent. Mild S shaped curvature.

Anatomy: Pneumatization superior to both anterior ethmoid notches.
Symmetric and intact olfactory grooves and fovea ethmoidalis, Keros
I (1-3mm). Sellar sphenoid pneumatization pattern. No dehiscence of
carotid or optic canals. No onodi cell.

Other: Unerupted wisdom teeth at the right maxilla and bilateral
mandible without acute complication. No other active dental or
periodontal disease identified.
IMPRESSION: No evidence of acute or chronic sinus inflammatory disease. No
abnormality seen to explain the presenting symptoms. Patient does
have unerupted wisdom teeth at the right maxilla and both sides of
the mandible, but without apparent complicating feature.

## 2022-07-19 ENCOUNTER — Other Ambulatory Visit: Payer: Self-pay | Admitting: *Deleted

## 2022-07-19 MED ORDER — OXCARBAZEPINE 300 MG PO TABS: 900.0000 mg | ORAL_TABLET | Freq: Two times a day (BID) | ORAL | 5 refills | Status: DC

## 2022-08-25 ENCOUNTER — Telehealth: Payer: Self-pay | Admitting: Psychiatry

## 2022-08-25 NOTE — Telephone Encounter (Signed)
Patient returned call. Please reach out again to the preferred number in his chart. Thanks!

## 2022-08-25 NOTE — Telephone Encounter (Signed)
LVM for pt to call back about Gabapentin

## 2022-08-25 NOTE — Telephone Encounter (Signed)
Called pt and he stated that he gets indigestion and an upset stomach with the Gabapentin 300mg . Asked pt if he was eating with taking the medication, and pt stated that he hasn't. Told pt to try eating with taking his medication and if his stomach doesn't get better to call us back. Pt verbalized understanding.

## 2022-08-25 NOTE — Telephone Encounter (Signed)
Pt stated he needs to talk to nurse about gabapentin (NEURONTIN) 300 MG capsule. Stated medication isn't working for his stomach.

## 2022-09-01 DIAGNOSIS — S0430XA Injury of trigeminal nerve, unspecified side, initial encounter: Secondary | ICD-10-CM | POA: Diagnosis not present

## 2022-09-01 DIAGNOSIS — B356 Tinea cruris: Secondary | ICD-10-CM | POA: Diagnosis not present

## 2022-09-01 DIAGNOSIS — E6609 Other obesity due to excess calories: Secondary | ICD-10-CM | POA: Diagnosis not present

## 2022-09-01 DIAGNOSIS — N529 Male erectile dysfunction, unspecified: Secondary | ICD-10-CM | POA: Diagnosis not present

## 2022-09-01 DIAGNOSIS — E119 Type 2 diabetes mellitus without complications: Secondary | ICD-10-CM | POA: Diagnosis not present

## 2022-09-01 DIAGNOSIS — R140 Abdominal distension (gaseous): Secondary | ICD-10-CM | POA: Diagnosis not present

## 2022-09-01 DIAGNOSIS — Z6833 Body mass index (BMI) 33.0-33.9, adult: Secondary | ICD-10-CM | POA: Diagnosis not present

## 2022-09-01 DIAGNOSIS — E785 Hyperlipidemia, unspecified: Secondary | ICD-10-CM | POA: Diagnosis not present

## 2022-09-07 ENCOUNTER — Ambulatory Visit: Payer: BC Managed Care – PPO | Admitting: Family Medicine

## 2022-09-07 ENCOUNTER — Encounter: Payer: Self-pay | Admitting: Family Medicine

## 2022-09-07 VITALS — BP 152/89 | HR 61 | Ht 71.0 in | Wt 230.0 lb

## 2022-09-07 DIAGNOSIS — G500 Trigeminal neuralgia: Secondary | ICD-10-CM | POA: Diagnosis not present

## 2022-09-07 DIAGNOSIS — Z5181 Encounter for therapeutic drug level monitoring: Secondary | ICD-10-CM

## 2022-09-07 MED ORDER — OXCARBAZEPINE 300 MG PO TABS: 900.0000 mg | ORAL_TABLET | Freq: Two times a day (BID) | ORAL | 3 refills | Status: DC

## 2022-09-07 MED ORDER — GABAPENTIN 300 MG PO CAPS: 300.0000 mg | ORAL_CAPSULE | Freq: Three times a day (TID) | ORAL | 3 refills | Status: DC

## 2022-09-07 NOTE — Patient Instructions (Signed)
Below is our plan:  We will continue oxcarbazepine 900mg  twice daily with gabapentin 600mg  at lunch. You may slowly wean medication by reducing 1 tablet every 2-3 weeks if pain remains well managed. Use baclofen as needed for breakthrough pain. Consider referral to neurosurgery for procedural based management options if needed.   Please make sure you are staying well hydrated. I recommend 50-60 ounces daily. Well balanced diet and regular exercise encouraged. Consistent sleep schedule with 6-8 hours recommended.   Please continue follow up with care team as directed.   Follow up with me in 1 year   You may receive a survey regarding today's visit. I encourage you to leave honest feed back as I do use this information to improve patient care. Thank you for seeing me today!

## 2022-09-07 NOTE — Progress Notes (Signed)
Chief Complaint  Patient presents with   Follow-up    Rm 1. Alone. C/o stomach discomfort following a colonoscopy. He takes three 300 mg oxcarbazepine in the morning, takes two 300 mg gabapentin at lunch, and two 300 mg oxcarbazepine at dinner. He would like to discuss alternatives to medication due to concern for GI side effect. His pain is well controlled.    HISTORY OF PRESENT ILLNESS:  09/07/22 ALL:  Jeffery Curry is a 62 y.o. male here today for follow up for trigeminal neuralgia. He was last seen by Dr Delena Bali 01/2022. He contnues to have breakthrough pain on oxcarb 900mg  BID. Botox was denied. Referral to NS declined by patient. Gabapentin added 03/2022.   Since, he reports doing fairly well. He is now taking oxcarb 900mg  in am and pm and gabapentin 600mg  at lunch. He reports pain is well managed. He has not had any significant pain since adding gabapentin. He has not used baclofen recently. He is tolerating meds well. Mild GI side effects but better when taking meds with food.    HISTORY (copied from Dr Quentin Mulling previous note)  62 year old male who follows in clinic for right sided trigeminal neuralgia. MRI/MRA brain 04/21/21 were normal without evidence of vascular compression of the trigeminal nerve.   At his last visit, oxcarbazepine was increased to 600 mg BID and baclofen was increased to 15 mg TID PRN.   Interval History: He started to develop breakthrough pain in September and his dose of oxcarbazepine was increased to 900 mg BID. Taking 900 mg BID gave him cognitive side effects so he has been spacing the dose out. He is currently taking this as 600 mg in the AM, 300 mg at 10 AM, 300 mg at 12:30 pm, 300 mg at 5 mg, and 300 mg at bedtime. This does help his facial pain as long as he spaces the dose out. Wife still is concerned about cognitive changes on the medication. Still will sometimes have breakthrough pain when he brushes his teeth or touches his face. Has not been  taking baclofen as he has not found it helpful.   Prior Therapies                                  oxcarbazepine 600 mg BID Baclofen 15 mg PRN   REVIEW OF SYSTEMS: Out of a complete 14 system review of symptoms, the patient complains only of the following symptoms, right facial pain and all other reviewed systems are negative.   ALLERGIES: No Known Allergies   HOME MEDICATIONS: Outpatient Medications Prior to Visit  Medication Sig Dispense Refill   Fexofenadine HCl (ALLEGRA PO) Take 1 tablet by mouth as needed. Allegra d     fluticasone (FLONASE) 50 MCG/ACT nasal spray Place 2 sprays into both nostrils 2 (two) times daily as needed.     Pseudoephedrine-Ibuprofen (ADVIL COLD/SINUS PO) Take 1 tablet by mouth as needed.     tadalafil (CIALIS) 5 MG tablet SMARTSIG:1-4 Tablet(s) By Mouth PRN     gabapentin (NEURONTIN) 300 MG capsule Take 1 capsule (300 mg total) by mouth 3 (three) times daily. 90 capsule 5   Oxcarbazepine (TRILEPTAL) 300 MG tablet Take 3 tablets (900 mg total) by mouth 2 (two) times daily. 180 tablet 5   baclofen (LIORESAL) 10 MG tablet TAKE 1 AND 1/2 TABLETS BY MOUTH AS NEEDED FOR FACIAL PAIN FLARES (Patient not taking: Reported  on 09/07/2022) 45 tablet 4   Cholecalciferol (VITAMIN D3 PO) Take by mouth daily. (Patient not taking: Reported on 09/07/2022)     Cyanocobalamin (VITAMIN B 12 PO) Take by mouth. (Patient not taking: Reported on 09/07/2022)     rosuvastatin (CRESTOR) 10 MG tablet Take 10 mg by mouth daily. (Patient not taking: Reported on 09/07/2022)     gabapentin (NEURONTIN) 300 MG capsule TAKE 1 CAPSULE BY MOUTH EVERY NIGHT AT BEDTIME FOR 7 DAYS, THEN 1 CAPSULE TWICE DAILY FOR 7 DAYS, THEN 1 CAPSULE BY MOUTH THREE TIMES DAILY 63 capsule 3   No facility-administered medications prior to visit.     PAST MEDICAL HISTORY: Past Medical History:  Diagnosis Date   Right sided facial pain      PAST SURGICAL HISTORY: Past Surgical History:  Procedure Laterality  Date   COLONOSCOPY  03/2022   ROOT CANAL     TOOTH EXTRACTION       FAMILY HISTORY: Family History  Problem Relation Age of Onset   Neuropathy Neg Hx      SOCIAL HISTORY: Social History   Socioeconomic History   Marital status: Married    Spouse name: Not on file   Number of children: Not on file   Years of education: Not on file   Highest education level: Not on file  Occupational History   Not on file  Tobacco Use   Smoking status: Former    Types: Cigarettes   Smokeless tobacco: Never  Vaping Use   Vaping Use: Never used  Substance and Sexual Activity   Alcohol use: Yes    Alcohol/week: 2.0 standard drinks of alcohol    Types: 2 Cans of beer per week   Drug use: Not on file   Sexual activity: Not on file  Other Topics Concern   Not on file  Social History Narrative   Not on file   Social Determinants of Health   Financial Resource Strain: Not on file  Food Insecurity: Not on file  Transportation Needs: Not on file  Physical Activity: Not on file  Stress: Not on file  Social Connections: Not on file  Intimate Partner Violence: Not on file     PHYSICAL EXAM  Vitals:   09/07/22 1034  BP: (!) 152/89  Pulse: 61  Weight: 230 lb (104.3 kg)  Height: 5\' 11"  (1.803 m)   Body mass index is 32.08 kg/m.  Generalized: Well developed, in no acute distress  Cardiology: normal rate and rhythm, no murmur auscultated  Respiratory: clear to auscultation bilaterally    Neurological examination  Mentation: Alert oriented to time, place, history taking. Follows all commands speech and language fluent Cranial nerve II-XII: Pupils were equal round reactive to light. Extraocular movements were full, visual field were full on confrontational test. Facial sensation and strength were normal. Head turning and shoulder shrug  were normal and symmetric. Motor: The motor testing reveals 5 over 5 strength of all 4 extremities. Good symmetric motor tone is noted throughout.   Gait and station: Gait is normal.    DIAGNOSTIC DATA (LABS, IMAGING, TESTING) - I reviewed patient records, labs, notes, testing and imaging myself where available.  Lab Results  Component Value Date   WBC 6.0 12/13/2021   HGB 14.5 12/13/2021   HCT 44.3 12/13/2021   MCV 85 12/13/2021   PLT 256 12/13/2021      Component Value Date/Time   NA 139 12/13/2021 0919   K 5.1 12/13/2021 0919   CL 100  12/13/2021 0919   CO2 27 12/13/2021 0919   GLUCOSE 104 (H) 12/13/2021 0919   BUN 9 12/13/2021 0919   CREATININE 1.01 12/13/2021 0919   CALCIUM 9.3 12/13/2021 0919   PROT 6.7 12/13/2021 0919   ALBUMIN 4.4 12/13/2021 0919   AST 16 12/13/2021 0919   ALT 23 12/13/2021 0919   ALKPHOS 87 12/13/2021 0919   BILITOT 0.3 12/13/2021 0919   No results found for: "CHOL", "HDL", "LDLCALC", "LDLDIRECT", "TRIG", "CHOLHDL" No results found for: "HGBA1C" No results found for: "VITAMINB12" No results found for: "TSH"      No data to display               No data to display           ASSESSMENT AND PLAN  62 y.o. year old male  has a past medical history of Right sided facial pain. here with    Trigeminal neuralgia - Plan: CMP, CBC with Differential/Platelets, 10-Hydroxycarbazepine  Medication monitoring encounter - Plan: CMP, CBC with Differential/Platelets, 10-Hydroxycarbazepine  Jeffery Curry is doing well. Pain is well controlled on current regimen of oxcarb 900mg  in am and pm with gabapentin 600mg  at lunch. We have discussed slow wean in the future if pain remains well controlled. He may use baclofen as needed. He will continue taking medication with food. Referral to NS discussed but declined at this time. Healthy lifestyle habits encouraged. He will follow up with PCP as directed. He will return to see me in 1 year, sooner if needed. He verbalizes understanding and agreement with this plan.   Orders Placed This Encounter  Procedures   CMP   CBC with Differential/Platelets    10-Hydroxycarbazepine     Meds ordered this encounter  Medications   gabapentin (NEURONTIN) 300 MG capsule    Sig: Take 1 capsule (300 mg total) by mouth 3 (three) times daily.    Dispense:  270 capsule    Refill:  3    Order Specific Question:   Supervising Provider    Answer:   Anson Fret [[id number]]   Oxcarbazepine (TRILEPTAL) 300 MG tablet    Sig: Take 3 tablets (900 mg total) by mouth 2 (two) times daily.    Dispense:  540 tablet    Refill:  3    Order Specific Question:   Supervising Provider    Answer:   Anson Fret [[id number]]     Shawnie Dapper, MSN, FNP-C 09/07/2022, 11:05 AM  [facility] [address] [place], [place] [address] [phone]

## 2022-09-13 LAB — COMPREHENSIVE METABOLIC PANEL
ALT: 22 IU/L (ref 0–44)
AST: 21 IU/L (ref 0–40)
Albumin/Globulin Ratio: 2.1
Albumin: 4.6 g/dL (ref 3.9–4.9)
Alkaline Phosphatase: 97 IU/L (ref 44–121)
BUN/Creatinine Ratio: 13 (ref 10–24)
BUN: 12 mg/dL (ref 8–27)
Bilirubin Total: 0.5 mg/dL (ref 0.0–1.2)
CO2: 24 mmol/L (ref 20–29)
Calcium: 8.9 mg/dL (ref 8.6–10.2)
Chloride: 102 mmol/L (ref 96–106)
Creatinine, Ser: 0.92 mg/dL (ref 0.76–1.27)
Globulin, Total: 2.2 g/dL (ref 1.5–4.5)
Glucose: 109 mg/dL — ABNORMAL HIGH (ref 70–99)
Potassium: 4.1 mmol/L (ref 3.5–5.2)
Sodium: 139 mmol/L (ref 134–144)
Total Protein: 6.8 g/dL (ref 6.0–8.5)
eGFR: 94 mL/min/{1.73_m2} (ref >59)

## 2022-09-13 LAB — CBC WITH DIFFERENTIAL/PLATELET
Basophils Absolute: 0.1 10*3/uL (ref 0.0–0.2)
Basos: 1 %
EOS (ABSOLUTE): 0 10*3/uL (ref 0.0–0.4)
Eos: 1 %
Hematocrit: 44.1 % (ref 37.5–51.0)
Hemoglobin: 14.5 g/dL (ref 13.0–17.7)
Immature Grans (Abs): 0 10*3/uL (ref 0.0–0.1)
Immature Granulocytes: 1 %
Lymphocytes Absolute: 1.2 10*3/uL (ref 0.7–3.1)
Lymphs: 19 %
MCH: 27.5 pg (ref 26.6–33.0)
MCHC: 32.9 g/dL (ref 31.5–35.7)
MCV: 84 fL (ref 79–97)
Monocytes Absolute: 0.6 10*3/uL (ref 0.1–0.9)
Monocytes: 10 %
Neutrophils Absolute: 4.4 10*3/uL (ref 1.4–7.0)
Neutrophils: 68 %
Platelets: 244 10*3/uL (ref 150–450)
RBC: 5.28 x10E6/uL (ref 4.14–5.80)
RDW: 13.2 % (ref 11.6–15.4)
WBC: 6.5 10*3/uL (ref 3.4–10.8)

## 2022-09-13 LAB — 10-HYDROXYCARBAZEPINE: Oxcarbazepine SerPl-Mcnc: 31 ug/mL (ref 10–35)

## 2022-09-21 DIAGNOSIS — K219 Gastro-esophageal reflux disease without esophagitis: Secondary | ICD-10-CM | POA: Diagnosis not present

## 2022-09-21 DIAGNOSIS — R1013 Epigastric pain: Secondary | ICD-10-CM | POA: Diagnosis not present

## 2022-09-23 ENCOUNTER — Encounter (HOSPITAL_COMMUNITY): Payer: Self-pay | Admitting: Emergency Medicine

## 2022-09-23 ENCOUNTER — Emergency Department (HOSPITAL_COMMUNITY)
Admission: EM | Admit: 2022-09-23 | Discharge: 2022-09-24 | Disposition: A | Discharge status: Home / Self Care | Payer: BC Managed Care – PPO | Attending: Emergency Medicine | Admitting: Emergency Medicine

## 2022-09-23 ENCOUNTER — Other Ambulatory Visit: Payer: Self-pay

## 2022-09-23 DIAGNOSIS — K4090 Unilateral inguinal hernia, without obstruction or gangrene, not specified as recurrent: Secondary | ICD-10-CM | POA: Diagnosis not present

## 2022-09-23 DIAGNOSIS — K429 Umbilical hernia without obstruction or gangrene: Secondary | ICD-10-CM | POA: Diagnosis not present

## 2022-09-23 DIAGNOSIS — R109 Unspecified abdominal pain: Secondary | ICD-10-CM | POA: Diagnosis not present

## 2022-09-23 DIAGNOSIS — K439 Ventral hernia without obstruction or gangrene: Secondary | ICD-10-CM | POA: Diagnosis not present

## 2022-09-23 DIAGNOSIS — Z87891 Personal history of nicotine dependence: Secondary | ICD-10-CM | POA: Diagnosis not present

## 2022-09-23 DIAGNOSIS — K30 Functional dyspepsia: Secondary | ICD-10-CM | POA: Diagnosis not present

## 2022-09-23 DIAGNOSIS — R1084 Generalized abdominal pain: Secondary | ICD-10-CM | POA: Diagnosis not present

## 2022-09-23 LAB — COMPREHENSIVE METABOLIC PANEL
ALT: 23 U/L (ref 0–44)
AST: 18 U/L (ref 15–41)
Albumin: 4.5 g/dL (ref 3.5–5.0)
Alkaline Phosphatase: 87 U/L (ref 38–126)
Anion gap: 11 (ref 5–15)
BUN: 8 mg/dL (ref 8–23)
CO2: 23 mmol/L (ref 22–32)
Calcium: 9.2 mg/dL (ref 8.9–10.3)
Chloride: 102 mmol/L (ref 98–111)
Creatinine, Ser: 0.9 mg/dL (ref 0.61–1.24)
GFR, Estimated: >60 mL/min (ref >60)
Glucose, Bld: 143 mg/dL — ABNORMAL HIGH (ref 70–99)
Potassium: 3.6 mmol/L (ref 3.5–5.1)
Sodium: 136 mmol/L (ref 135–145)
Total Bilirubin: 1.3 mg/dL — ABNORMAL HIGH (ref 0.3–1.2)
Total Protein: 7.8 g/dL (ref 6.5–8.1)

## 2022-09-23 LAB — URINALYSIS, ROUTINE W REFLEX MICROSCOPIC
Bacteria, UA: NONE SEEN
Glucose, UA: NEGATIVE mg/dL
Hgb urine dipstick: NEGATIVE
Ketones, ur: 20 mg/dL — AB
Leukocytes,Ua: NEGATIVE
Nitrite: NEGATIVE
Protein, ur: 100 mg/dL — AB
Specific Gravity, Urine: 1.033 — ABNORMAL HIGH (ref 1.005–1.030)
pH: 7 (ref 5.0–8.0)

## 2022-09-23 LAB — CBC
HCT: 49.2 % (ref 39.0–52.0)
Hemoglobin: 16.7 g/dL (ref 13.0–17.0)
MCH: 28.4 pg (ref 26.0–34.0)
MCHC: 33.9 g/dL (ref 30.0–36.0)
MCV: 83.5 fL (ref 80.0–100.0)
Platelets: 273 10*3/uL (ref 150–400)
RBC: 5.89 MIL/uL — ABNORMAL HIGH (ref 4.22–5.81)
RDW: 13.2 % (ref 11.5–15.5)
WBC: 10.3 10*3/uL (ref 4.0–10.5)
nRBC: 0 % (ref 0.0–0.2)

## 2022-09-23 LAB — LIPASE, BLOOD: Lipase: 29 U/L (ref 11–51)

## 2022-09-23 MED ORDER — ONDANSETRON HCL 4 MG/2ML IJ SOLN: 4.0000 mg | Freq: Once | INTRAMUSCULAR | Status: DC | PRN

## 2022-09-23 NOTE — ED Triage Notes (Signed)
Pt c/o mid abdominal pain x a week or so, recently seen by PCP and GI and scheduled for endoscopy next week. Today pt reports cold sweats, projectile vomiting, bloating, gas, and severe pain. Pain currently rated 8/10 cramping. Estimates 2 episodes of vomiting today.

## 2022-09-23 NOTE — ED Provider Notes (Signed)
[facility]-EMERGENCY DEPT [facility] Emergency Department Provider Note MRN:  [id number]  Arrival date & time: 09/24/22     Chief Complaint   Abdominal Pain   History of Present Illness   Jeffery Curry is a 62 y.o. year-old male with no pertinent past medical history presenting to the ED with chief complaint of abdominal pain.  Intermittent severe abdominal pain after eating over the past several days to weeks.  Was really bad this evening, associated with excessive belching.  Feels a bit better now after the belching.  Has not really been able to pass gas from below, not as many bowel movements as normal.  Review of Systems  A thorough review of systems was obtained and all systems are negative except as noted in the HPI and PMH.   Patient's Health History    Past Medical History:  Diagnosis Date   Right sided facial pain     Past Surgical History:  Procedure Laterality Date   COLONOSCOPY  03/2022   ROOT CANAL     TOOTH EXTRACTION      Family History  Problem Relation Age of Onset   Neuropathy Neg Hx     Social History   Socioeconomic History   Marital status: Married    Spouse name: Not on file   Number of children: Not on file   Years of education: Not on file   Highest education level: Not on file  Occupational History   Not on file  Tobacco Use   Smoking status: Former    Types: Cigarettes   Smokeless tobacco: Never  Vaping Use   Vaping Use: Never used  Substance and Sexual Activity   Alcohol use: Yes    Alcohol/week: 2.0 standard drinks of alcohol    Types: 2 Cans of beer per week   Drug use: Not on file   Sexual activity: Not on file  Other Topics Concern   Not on file  Social History Narrative   Not on file   Social Determinants of Health   Financial Resource Strain: Not on file  Food Insecurity: Not on file  Transportation Needs: Not on file  Physical Activity: Not on file  Stress: Not on file  Social Connections: Not on file  Intimate  Partner Violence: Not on file     Physical Exam   Vitals:   09/23/22 2300 09/23/22 2330  BP: 138/81 (!) 163/82  Pulse: 62 63  Resp:    Temp:    SpO2: 95% 96%    CONSTITUTIONAL: Well-appearing, NAD NEURO/PSYCH:  Alert and oriented x 3, no focal deficits EYES:  eyes equal and reactive ENT/NECK:  no LAD, no JVD CARDIO: Regular rate, well-perfused, normal S1 and S2 PULM:  CTAB no wheezing or rhonchi GI/GU:  non-distended, non-tender MSK/SPINE:  No gross deformities, no edema SKIN:  no rash, atraumatic   *Additional and/or pertinent findings included in MDM below  Diagnostic and Interventional Summary    EKG Interpretation Date/Time:    Ventricular Rate:    PR Interval:    QRS Duration:    QT Interval:    QTC Calculation:   R Axis:      Text Interpretation:         Labs Reviewed  COMPREHENSIVE METABOLIC PANEL - Abnormal; Notable for the following components:      Result Value   Glucose, Bld 143 (*)    Total Bilirubin 1.3 (*)    All other components within normal limits  CBC - Abnormal;  Notable for the following components:   RBC 5.89 (*)    All other components within normal limits  URINALYSIS, ROUTINE W REFLEX MICROSCOPIC - Abnormal; Notable for the following components:   Color, Urine AMBER (*)    Specific Gravity, Urine 1.033 (*)    Bilirubin Urine SMALL (*)    Ketones, ur 20 (*)    Protein, ur 100 (*)    All other components within normal limits  LIPASE, BLOOD    CT ABDOMEN PELVIS W CONTRAST  Final Result      Medications  ondansetron (ZOFRAN) injection 4 mg (has no administration in time range)  iohexol (OMNIPAQUE) 300 MG/ML solution 100 mL (100 mLs Intravenous Contrast Given 09/24/22 0028)     Procedures  /  Critical Care Procedures  ED Course and Medical Decision Making  Initial Impression and Ddx 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  Still has some mild  diffuse tenderness.  Awaiting CT.  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  Also some evidence of possible enteritis versus low-grade SBO.  Patient Reassessment and Ultimate Disposition/Management     Patient's symptoms completely resolved and he felt well without any symptoms for a few hours here in the emergency department.  And so highly doubt acute or active SBO at this time.  There is suspicion for intermittent SBO due to malrotation.  Doubt need for emergent surgical intervention at this time.  Patient needs follow-up with both GI and general surgery.  Very strict return precautions discussed.  Patient management required discussion with the following services or consulting groups:  None  Complexity of Problems Addressed Acute illness or injury that poses threat of life of bodily function  Additional Data Reviewed and Analyzed Further history obtained from: Further history from spouse/family member  Additional Factors Impacting ED Encounter Risk None  Elmer Sow. Pilar Plate, MD [facility] Emergency Medicine [facility] [email or web address]  Final Clinical Impressions(s) / ED Diagnoses     ICD-10-CM   1. Generalized abdominal pain  R10.84       ED Discharge Orders     None        Discharge Instructions Discussed with and Provided to Patient:     Discharge Instructions      You were evaluated in the Emergency Department and after careful evaluation, we did not find any emergent condition requiring admission or further testing in the hospital.  Your exam/testing today is overall reassuring.  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   Recommend keeping follow-up with your GI doctor, also recommend following up with Dr. Lovell Sheehan of general surgery.  Please return to the Emergency Department if you experience any worsening of your condition.   Thank you for allowing [place] to be a part of your care.       Sabas Sous, MD 09/24/22 [id number]

## 2022-09-24 ENCOUNTER — Emergency Department (HOSPITAL_COMMUNITY): Payer: BC Managed Care – PPO

## 2022-09-24 DIAGNOSIS — K439 Ventral hernia without obstruction or gangrene: Secondary | ICD-10-CM | POA: Diagnosis not present

## 2022-09-24 DIAGNOSIS — K4090 Unilateral inguinal hernia, without obstruction or gangrene, not specified as recurrent: Secondary | ICD-10-CM | POA: Diagnosis not present

## 2022-09-24 DIAGNOSIS — K30 Functional dyspepsia: Secondary | ICD-10-CM | POA: Diagnosis not present

## 2022-09-24 DIAGNOSIS — K429 Umbilical hernia without obstruction or gangrene: Secondary | ICD-10-CM | POA: Diagnosis not present

## 2022-09-24 MED ORDER — IOHEXOL 300 MG/ML  SOLN
100.0000 mL | Freq: Once | INTRAMUSCULAR | Status: AC | PRN
  Administered 2022-09-24: 100 mL via INTRAVENOUS

## 2022-09-24 MED ORDER — ONDANSETRON 4 MG PO TBDP: 4.0000 mg | ORAL_TABLET | Freq: Three times a day (TID) | ORAL | 0 refills | Status: DC | PRN

## 2022-09-24 NOTE — Discharge Instructions (Signed)
You were evaluated in the Emergency Department and after careful evaluation, we did not find any emergent condition requiring admission or further testing in the hospital.  Your exam/testing today is overall reassuring.  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  Recommend keeping follow-up with your GI doctor, also recommend following up with Dr. Lovell Sheehan of general surgery.  Please return to the Emergency Department if you experience any worsening of your condition.   Thank you for allowing [place] to be a part of your care.

## 2022-09-26 ENCOUNTER — Encounter: Payer: Self-pay | Admitting: Family Medicine

## 2022-09-26 ENCOUNTER — Other Ambulatory Visit: Payer: Self-pay

## 2022-09-30 DIAGNOSIS — K2970 Gastritis, unspecified, without bleeding: Secondary | ICD-10-CM | POA: Diagnosis not present

## 2022-09-30 DIAGNOSIS — R1013 Epigastric pain: Secondary | ICD-10-CM | POA: Diagnosis not present

## 2022-09-30 DIAGNOSIS — K219 Gastro-esophageal reflux disease without esophagitis: Secondary | ICD-10-CM | POA: Diagnosis not present

## 2022-10-05 DIAGNOSIS — C61 Malignant neoplasm of prostate: Secondary | ICD-10-CM | POA: Diagnosis not present

## 2022-10-05 DIAGNOSIS — N5201 Erectile dysfunction due to arterial insufficiency: Secondary | ICD-10-CM | POA: Diagnosis not present

## 2022-10-13 ENCOUNTER — Other Ambulatory Visit: Payer: Self-pay | Admitting: Urology

## 2022-10-13 DIAGNOSIS — C61 Malignant neoplasm of prostate: Secondary | ICD-10-CM

## 2022-10-18 ENCOUNTER — Encounter: Payer: Self-pay | Admitting: General Surgery

## 2022-10-18 ENCOUNTER — Ambulatory Visit: Payer: BC Managed Care – PPO | Admitting: General Surgery

## 2022-10-18 VITALS — BP 152/85 | HR 59 | Temp 98.0°F | Resp 12 | Ht 71.0 in | Wt 222.0 lb

## 2022-10-18 DIAGNOSIS — K56609 Unspecified intestinal obstruction, unspecified as to partial versus complete obstruction: Secondary | ICD-10-CM | POA: Diagnosis not present

## 2022-10-18 NOTE — Progress Notes (Signed)
Jeffery Curry; [id number]; 08/29/1960   HPI Patient is a 62 year old white male who was referred to my care by the emergency room and Dr. Assunta Found for evaluation and treatment of abdominal pain.  He was seen in the emergency room on 09/23/2022 for an episode of severe abdominal pain.  He underwent a CT scan of the abdomen which revealed enteritis versus a partial bowel obstruction as well as a suspicion for a malrotation of the small intestine.  He was discharged from the emergency room as his abdominal pain resolved.  He states that he has not had any significant abdominal pain since that time.  He has never had any similar episode.  He only went to the emergency room because it was so severe.  Since that time, he is having normal bowel movements.  He had times has multiple bowel movements throughout the day.  He denies any nausea or vomiting.  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  He remembers having a colostomy temporarily at that time.  He has never been admitted to the hospital for a bowel obstruction. Past Medical History:  Diagnosis Date   Right sided facial pain     Past Surgical History:  Procedure Laterality Date   COLONOSCOPY  03/2022   ROOT CANAL     TOOTH EXTRACTION      Family History  Problem Relation Age of Onset   Neuropathy Neg Hx     Current Outpatient Medications on File Prior to Visit  Medication Sig Dispense Refill   baclofen (LIORESAL) 10 MG tablet TAKE 1 AND 1/2 TABLETS BY MOUTH AS NEEDED FOR FACIAL PAIN FLARES 45 tablet 4   omeprazole (PRILOSEC) 20 MG capsule Take 20 mg by mouth daily.     Oxcarbazepine (TRILEPTAL) 300 MG tablet Take 3 tablets (900 mg total) by mouth 2 (two) times daily. 540 tablet 3   tadalafil (CIALIS) 5 MG tablet SMARTSIG:1-4 Tablet(s) By Mouth PRN     Fexofenadine HCl (ALLEGRA PO) Take 1 tablet by mouth as needed. Allegra d     fluticasone (FLONASE) 50 MCG/ACT nasal spray Place 2 sprays into both nostrils 2  (two) times daily as needed.     No current facility-administered medications on file prior to visit.    No Known Allergies  Social History   Substance and Sexual Activity  Alcohol Use Yes   Alcohol/week: 2.0 standard drinks of alcohol   Types: 2 Cans of beer per week    Social History   Tobacco Use  Smoking Status Former   Types: Cigarettes  Smokeless Tobacco Never    Review of Systems  Constitutional: Negative.   HENT: Negative.    Eyes: Negative.   Respiratory: Negative.    Cardiovascular: Negative.   Gastrointestinal:  Positive for abdominal pain and nausea.  Genitourinary: Negative.   Musculoskeletal: Negative.   Skin: Negative.   Neurological: Negative.   Endo/Heme/Allergies: Negative.   Psychiatric/Behavioral: Negative.      Objective   Vitals:   10/18/22 1408  BP: (!) 152/85  Pulse: (!) 59  Resp: 12  Temp: 98 F (36.7 C)  SpO2: 96%    Physical Exam Vitals reviewed.  Constitutional:      Appearance: Normal appearance. He is normal weight. He is not ill-appearing.  HENT:     Head: Normocephalic and atraumatic.  Cardiovascular:     Rate and Rhythm: Normal rate and regular rhythm.     Heart sounds: Normal heart sounds. No murmur  heard.    No friction rub. No gallop.  Pulmonary:     Effort: Pulmonary effort is normal. No respiratory distress.     Breath sounds: Normal breath sounds. No stridor. No wheezing, rhonchi or rales.  Abdominal:     General: Bowel sounds are normal. There is no distension.     Palpations: Abdomen is soft. There is no mass.     Tenderness: There is no abdominal tenderness. There is no guarding or rebound.     Hernia: No hernia is present.     Comments: A large left lower quadrant surgical scar is present.  Skin:    General: Skin is warm and dry.  Neurological:     Mental Status: He is alert and oriented to person, place, and time.    ER notes reviewed CT scan images personally reviewed Assessment  History of  abdominal pain that has since resolved.  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  This does not seem to involve the colon as the cecum is in the right lower quadrant.  Given his age, it is doubtful that this malrotation is of clinical significance.  He may have had this taking care of of when they operated on his bowels for Hirschsprung's disease.  He has never been told about this possible malrotation. Plan  At this point, there is no need for acute surgical intervention.  He states he still has significant belching, but does not have episodes of emesis.  He has had both an upper and lower endoscopy.  I told him to follow-up with his gastroenterologist for further management and treatment.  Should he have recurrence of the obstruction, exploration may be warranted.  He understands this and agrees.  Follow-up here as needed.

## 2022-10-19 DIAGNOSIS — K219 Gastro-esophageal reflux disease without esophagitis: Secondary | ICD-10-CM | POA: Diagnosis not present

## 2022-10-19 DIAGNOSIS — Z6832 Body mass index (BMI) 32.0-32.9, adult: Secondary | ICD-10-CM | POA: Diagnosis not present

## 2022-10-19 DIAGNOSIS — E6609 Other obesity due to excess calories: Secondary | ICD-10-CM | POA: Diagnosis not present

## 2022-10-19 DIAGNOSIS — R140 Abdominal distension (gaseous): Secondary | ICD-10-CM | POA: Diagnosis not present

## 2022-11-26 ENCOUNTER — Inpatient Hospital Stay: Admission: RE | Admit: 2022-11-26 | Payer: BC Managed Care – PPO | Source: Ambulatory Visit

## 2023-01-07 ENCOUNTER — Ambulatory Visit
Admission: RE | Admit: 2023-01-07 | Discharge: 2023-01-07 | Disposition: A | Discharge status: Home / Self Care | Payer: BC Managed Care – PPO | Source: Ambulatory Visit | Attending: Urology | Admitting: Urology

## 2023-01-07 DIAGNOSIS — C61 Malignant neoplasm of prostate: Secondary | ICD-10-CM

## 2023-01-07 DIAGNOSIS — R972 Elevated prostate specific antigen [PSA]: Secondary | ICD-10-CM | POA: Diagnosis not present

## 2023-01-07 MED ORDER — GADOPICLENOL 0.5 MMOL/ML IV SOLN
10.0000 mL | Freq: Once | INTRAVENOUS | Status: AC | PRN
  Administered 2023-01-07: 10 mL via INTRAVENOUS

## 2023-01-16 ENCOUNTER — Other Ambulatory Visit: Payer: Self-pay

## 2023-01-16 MED ORDER — OXCARBAZEPINE 300 MG PO TABS: 900.0000 mg | ORAL_TABLET | Freq: Two times a day (BID) | ORAL | 3 refills | Status: DC

## 2023-04-17 DIAGNOSIS — N5201 Erectile dysfunction due to arterial insufficiency: Secondary | ICD-10-CM | POA: Diagnosis not present

## 2023-04-17 DIAGNOSIS — C61 Malignant neoplasm of prostate: Secondary | ICD-10-CM | POA: Diagnosis not present

## 2023-09-06 NOTE — Patient Instructions (Addendum)
 Below is our plan:  We will try to reduce oxcarbazepine  to 600mg  in morning, 300mg  at lunch and 300mg  at bedtime. If well controlled in 2-3 weeks, try reducing dose to 300mg  three times daily. Use baclofen  as needed for breakthrough pain. We will place referral to [facility] for a second opinion. Consider procedural based treatment options like radiofrequency ablation.   Please follow up with your PCP asap to discuss mood changes as well. Seek emergency medical attention for any concerns of suicidal or homicidal ideations.   Please make sure you are staying well hydrated. I recommend 50-60 ounces daily. Well balanced diet and regular exercise encouraged. Consistent sleep schedule with 6-8 hours recommended.   Please continue follow up with care team as directed.   Follow up with me pending visit with Carole Churches   You may receive a survey regarding today's visit. I encourage you to leave honest feed back as I do use this information to improve patient care. Thank you for seeing me today!

## 2023-09-06 NOTE — Progress Notes (Signed)
 Chief Complaint  Patient presents with   Follow-up    Pt in room 1. Wife in room. Here for trigeminal neuralgia follow up. Pt reports doing well with Oxcarbazepine . Wife said pt has highs and lows, stopped gabapentin  due to stomach issues. Wife said pt is sleepy a lot.     HISTORY OF PRESENT ILLNESS:  09/07/23 ALL:  Jeffery Curry returns for follow up for trigeminal neuralgia. He was last seen by me 08/2022 and felt pain was stable on oxcarb 900mg  BID and gabapentin  600mg  at lunch. Since, he reports pain is typically well managed. He switched dosing of oxcarb to 600mg  three times daily. He reports pain is well managed as long as he is consistent with dosing. He can tell if he gets behind in dosing. He has not needed to take baclofen , recently.   Gabapentin  stopped due to excessive sleepiness and GI upset. He was seen in ER for severe abdominal pain with concerns for malrotation of small intestine. He was seen by GI 09/2022 and symptoms had resolved. Monitoring advised. He does feel GI symptoms have improved, somewhat, after stopping gabapentin . He does still have intermittent abdominal pain and decreased appetite.   His wife presents with him, today, and expresses concerns with fluctuating mood. She reports that he seems to be much more easily irritated and sometimes verbally agressive. Jeffery Curry reports some physical altercations with inanimate objects but never physically aggressive with other people. Wife feels he is constantly fluctuating from high to low. She reports that he goes from being buzzed to crashing. He denies previous any significant history of anxiety or depression. He does have chronic fatigue. Symptoms seemed to start about 6-12 months ago. He has not been to see PCP. He does not wish to start mood management medicaitons. He denies SI/HI.   He continues close follow up with urology s/p prostate cancer. PSA remains stable.   09/07/2022 ALL:  Jeffery Curry is a 63 y.o. male  here today for follow up for trigeminal neuralgia. He was last seen by Dr Billy Bue 01/2022. He contnues to have breakthrough pain on oxcarb 900mg  BID. Botox was denied. Referral to NS declined by patient. Gabapentin  added 03/2022.   Since, he reports doing fairly well. He is now taking oxcarb 900mg  in am and pm and gabapentin  600mg  at lunch. He reports pain is well managed. He has not had any significant pain since adding gabapentin . He has not used baclofen  recently. He is tolerating meds well. Mild GI side effects but better when taking meds with food.   HISTORY (copied from Dr Margrette Shield previous note)  63 year old male who follows in clinic for right sided trigeminal neuralgia. MRI/MRA brain 04/21/21 were normal without evidence of vascular compression of the trigeminal nerve.   At his last visit, oxcarbazepine  was increased to 600 mg BID and baclofen  was increased to 15 mg TID PRN.   Interval History: He started to develop breakthrough pain in September and his dose of oxcarbazepine  was increased to 900 mg BID. Taking 900 mg BID gave him cognitive side effects so he has been spacing the dose out. He is currently taking this as 600 mg in the AM, 300 mg at 10 AM, 300 mg at 12:30 pm, 300 mg at 5 mg, and 300 mg at bedtime. This does help his facial pain as long as he spaces the dose out. Wife still is concerned about cognitive changes on the medication. Still will sometimes have breakthrough pain when he  brushes his teeth or touches his face. Has not been taking baclofen  as he has not found it helpful.   Prior Therapies                                  oxcarbazepine  600 mg BID Baclofen  15 mg PRN   REVIEW OF SYSTEMS: Out of a complete 14 system review of symptoms, the patient complains only of the following symptoms, right facial pain, irritability, fatigue and all other reviewed systems are negative.   ALLERGIES: No Known Allergies   HOME MEDICATIONS: Outpatient Medications Prior to Visit   Medication Sig Dispense Refill   Fexofenadine HCl (ALLEGRA PO) Take 1 tablet by mouth as needed. Allegra d     fluticasone (FLONASE) 50 MCG/ACT nasal spray Place 2 sprays into both nostrils 2 (two) times daily as needed.     omeprazole (PRILOSEC) 20 MG capsule Take 20 mg by mouth daily.     tadalafil (CIALIS) 5 MG tablet SMARTSIG:1-4 Tablet(s) By Mouth PRN     baclofen  (LIORESAL ) 10 MG tablet TAKE 1 AND 1/2 TABLETS BY MOUTH AS NEEDED FOR FACIAL PAIN FLARES 45 tablet 4   Oxcarbazepine  (TRILEPTAL ) 300 MG tablet Take 3 tablets (900 mg total) by mouth 2 (two) times daily. 540 tablet 3   No facility-administered medications prior to visit.     PAST MEDICAL HISTORY: Past Medical History:  Diagnosis Date   Right sided facial pain      PAST SURGICAL HISTORY: Past Surgical History:  Procedure Laterality Date   COLONOSCOPY  03/2022   ROOT CANAL     TOOTH EXTRACTION       FAMILY HISTORY: Family History  Problem Relation Age of Onset   Neuropathy Neg Hx      SOCIAL HISTORY: Social History   Socioeconomic History   Marital status: Married    Spouse name: Not on file   Number of children: Not on file   Years of education: Not on file   Highest education level: Not on file  Occupational History   Not on file  Tobacco Use   Smoking status: Former    Types: Cigarettes   Smokeless tobacco: Never  Vaping Use   Vaping status: Never Used  Substance and Sexual Activity   Alcohol use: Not Currently    Alcohol/week: 2.0 standard drinks of alcohol    Types: 2 Cans of beer per week   Drug use: Not on file    Comment: THC   Sexual activity: Not on file  Other Topics Concern   Not on file  Social History Narrative   Not on file   Social Drivers of Health   Financial Resource Strain: Not on file  Food Insecurity: Not on file  Transportation Needs: Not on file  Physical Activity: Not on file  Stress: Not on file  Social Connections: Not on file  Intimate Partner Violence:  Not on file     PHYSICAL EXAM  Vitals:   09/07/23 0951  BP: (!) 146/77  Pulse: 62  Weight: 212 lb 8 oz (96.4 kg)  Height: 5' 10 (1.778 m)    Body mass index is 30.49 kg/m.  Generalized: Well developed, in no acute distress  Cardiology: normal rate and rhythm, no murmur auscultated  Respiratory: clear to auscultation bilaterally    Neurological examination  Mentation: Alert oriented to time, place, history taking. Follows all commands speech and language fluent Cranial  nerve II-XII: Pupils were equal round reactive to light. Extraocular movements were full, visual field were full on confrontational test. Facial sensation and strength were normal. Head turning and shoulder shrug  were normal and symmetric. Motor: The motor testing reveals 5 over 5 strength of all 4 extremities. Good symmetric motor tone is noted throughout.  Gait and station: Gait is normal.    DIAGNOSTIC DATA (LABS, IMAGING, TESTING) - I reviewed patient records, labs, notes, testing and imaging myself where available.  Lab Results  Component Value Date   WBC 10.3 09/23/2022   HGB 16.7 09/23/2022   HCT 49.2 09/23/2022   MCV 83.5 09/23/2022   PLT 273 09/23/2022      Component Value Date/Time   NA 136 09/23/2022 1855   NA 139 09/07/2022 1102   K 3.6 09/23/2022 1855   CL 102 09/23/2022 1855   CO2 23 09/23/2022 1855   GLUCOSE 143 (H) 09/23/2022 1855   BUN 8 09/23/2022 1855   BUN 12 09/07/2022 1102   CREATININE 0.90 09/23/2022 1855   CALCIUM 9.2 09/23/2022 1855   PROT 7.8 09/23/2022 1855   PROT 6.8 09/07/2022 1102   ALBUMIN 4.5 09/23/2022 1855   ALBUMIN 4.6 09/07/2022 1102   AST 18 09/23/2022 1855   ALT 23 09/23/2022 1855   ALKPHOS 87 09/23/2022 1855   BILITOT 1.3 (H) 09/23/2022 1855   BILITOT 0.5 09/07/2022 1102   GFRNONAA >60 09/23/2022 1855   No results found for: CHOL, HDL, LDLCALC, LDLDIRECT, TRIG, CHOLHDL No results found for: FAOZ3Y No results found for:  VITAMINB12 No results found for: TSH      No data to display               No data to display           ASSESSMENT AND PLAN  63 y.o. year old male  has a past medical history of Right sided facial pain. here with    Trigeminal neuralgia - Plan: Oxcarbazepine  (Trileptal ), Serum, CMP, Ambulatory referral to Neurosurgery  Medication monitoring encounter - Plan: Oxcarbazepine  (Trileptal ), Serum, CMP  Irritability  Jeffery Curry is doing fairly well. Pain is well controlled on current regimen of oxcarb 600mg  in am, 300 at lunch and 600mg  at bedtime. Wife is concerned with recent fluctuations of mood. We have discussed possible side effects of meds versus concerns of depression/anxiety. No previous history. He denies SI/HI. We have discussed possible use of lamotrigine. Patient is hesitant regarding possible side effects. He is interested in meeting with NS at [facility] for second opinion. RFA discussed as possible treatment options. MRI/MRA were unremarkable 2023. For now, he will try to reduce oxcarb dose to 600/300/300 for 2-3 weeks. May then drop to 300mg  TID if well tolerated. He may use baclofen  as needed. He will continue taking medication with food. We will check labs, today. I have advised he follow up with PCP asap to discuss mood related concerns. Healthy lifestyle habits encouraged. He will follow up with PCP as directed. He will return to see me pending visit with [facility]. He verbalizes understanding and agreement with this plan.    Orders Placed This Encounter  Procedures   Oxcarbazepine  (Trileptal ), Serum   CMP   Ambulatory referral to Neurosurgery    Referral Priority:   Routine    Referral Type:   Surgical    Referral Reason:   Specialty Services Required    Requested Specialty:   Neurosurgery    Number of Visits Requested:  1     Meds ordered this encounter  Medications   Oxcarbazepine  (TRILEPTAL ) 300 MG tablet    Sig: Take 2 tablets (600 mg total) by  mouth 3 (three) times daily.    Dispense:  540 tablet    Refill:  3    Supervising Provider:   AHERN, ANTONIA B [[id number]]   baclofen  (LIORESAL ) 10 MG tablet    Sig: TAKE 1 AND 1/2 TABLETS BY MOUTH AS NEEDED FOR FACIAL PAIN FLARES    Dispense:  45 tablet    Refill:  4    Supervising Provider:   AHERN, ANTONIA B [[id number]]    I personally spent a total of 45 minutes in the care of the patient today including preparing to see the patient, getting/reviewing separately obtained history, performing a medically appropriate exam/evaluation, counseling and educating, placing orders, referring and communicating with other health care professionals, documenting clinical information in the EHR, independently interpreting results, and communicating results.   Terrilyn Fick, MSN, FNP-C 09/07/2023, 11:00 AM  [facility] [address] [place], [place] [address] [phone]

## 2023-09-07 ENCOUNTER — Encounter: Payer: Self-pay | Admitting: Family Medicine

## 2023-09-07 ENCOUNTER — Telehealth: Payer: Self-pay | Admitting: Family Medicine

## 2023-09-07 ENCOUNTER — Ambulatory Visit (INDEPENDENT_AMBULATORY_CARE_PROVIDER_SITE_OTHER): Payer: BC Managed Care – PPO | Admitting: Family Medicine

## 2023-09-07 VITALS — BP 146/77 | HR 62 | Ht 70.0 in | Wt 212.5 lb

## 2023-09-07 DIAGNOSIS — Z5181 Encounter for therapeutic drug level monitoring: Secondary | ICD-10-CM | POA: Diagnosis not present

## 2023-09-07 DIAGNOSIS — G500 Trigeminal neuralgia: Secondary | ICD-10-CM

## 2023-09-07 DIAGNOSIS — R454 Irritability and anger: Secondary | ICD-10-CM

## 2023-09-07 MED ORDER — BACLOFEN 10 MG PO TABS: ORAL_TABLET | ORAL | 4 refills | Status: AC

## 2023-09-07 MED ORDER — OXCARBAZEPINE 300 MG PO TABS: 600.0000 mg | ORAL_TABLET | Freq: Three times a day (TID) | ORAL | 3 refills | Status: DC

## 2023-09-07 NOTE — Telephone Encounter (Signed)
 Referral for neurosurgery fax to [facility]. Phone: [phone], Fax [phone]

## 2023-09-10 LAB — COMPREHENSIVE METABOLIC PANEL WITH GFR
ALT: 23 IU/L (ref 0–44)
AST: 21 IU/L (ref 0–40)
Albumin: 4.7 g/dL (ref 3.9–4.9)
Alkaline Phosphatase: 119 IU/L (ref 44–121)
BUN/Creatinine Ratio: 13 (ref 10–24)
BUN: 12 mg/dL (ref 8–27)
Bilirubin Total: 0.3 mg/dL (ref 0.0–1.2)
CO2: 25 mmol/L (ref 20–29)
Calcium: 9.4 mg/dL (ref 8.6–10.2)
Chloride: 101 mmol/L (ref 96–106)
Creatinine, Ser: 0.92 mg/dL (ref 0.76–1.27)
Globulin, Total: 2.1 g/dL (ref 1.5–4.5)
Glucose: 89 mg/dL (ref 70–99)
Potassium: 4.9 mmol/L (ref 3.5–5.2)
Sodium: 139 mmol/L (ref 134–144)
Total Protein: 6.8 g/dL (ref 6.0–8.5)
eGFR: 93 mL/min/{1.73_m2} (ref >59)

## 2023-09-10 LAB — OXCARBAZEPINE (TRILEPTAL), SERUM: Oxcarbazepine SerPl-Mcnc: 27 ug/mL (ref 10–35)

## 2023-09-11 ENCOUNTER — Ambulatory Visit: Payer: Self-pay | Admitting: Family Medicine

## 2023-10-09 DIAGNOSIS — C61 Malignant neoplasm of prostate: Secondary | ICD-10-CM | POA: Diagnosis not present

## 2023-10-10 DIAGNOSIS — R52 Pain, unspecified: Secondary | ICD-10-CM | POA: Diagnosis not present

## 2023-10-10 DIAGNOSIS — G500 Trigeminal neuralgia: Secondary | ICD-10-CM | POA: Diagnosis not present

## 2023-10-16 DIAGNOSIS — N5201 Erectile dysfunction due to arterial insufficiency: Secondary | ICD-10-CM | POA: Diagnosis not present

## 2023-10-16 DIAGNOSIS — C61 Malignant neoplasm of prostate: Secondary | ICD-10-CM | POA: Diagnosis not present

## 2023-11-09 DIAGNOSIS — S0430XA Injury of trigeminal nerve, unspecified side, initial encounter: Secondary | ICD-10-CM | POA: Diagnosis not present

## 2023-11-09 DIAGNOSIS — E6609 Other obesity due to excess calories: Secondary | ICD-10-CM | POA: Diagnosis not present

## 2023-11-09 DIAGNOSIS — Z0001 Encounter for general adult medical examination with abnormal findings: Secondary | ICD-10-CM | POA: Diagnosis not present

## 2023-11-09 DIAGNOSIS — Z6831 Body mass index (BMI) 31.0-31.9, adult: Secondary | ICD-10-CM | POA: Diagnosis not present

## 2023-11-09 DIAGNOSIS — K219 Gastro-esophageal reflux disease without esophagitis: Secondary | ICD-10-CM | POA: Diagnosis not present

## 2023-11-09 DIAGNOSIS — N529 Male erectile dysfunction, unspecified: Secondary | ICD-10-CM | POA: Diagnosis not present

## 2023-11-09 DIAGNOSIS — R972 Elevated prostate specific antigen [PSA]: Secondary | ICD-10-CM | POA: Diagnosis not present

## 2023-11-09 DIAGNOSIS — Z1331 Encounter for screening for depression: Secondary | ICD-10-CM | POA: Diagnosis not present

## 2023-11-09 DIAGNOSIS — R7309 Other abnormal glucose: Secondary | ICD-10-CM | POA: Diagnosis not present

## 2023-11-09 DIAGNOSIS — E785 Hyperlipidemia, unspecified: Secondary | ICD-10-CM | POA: Diagnosis not present

## 2024-01-23 ENCOUNTER — Other Ambulatory Visit: Payer: Self-pay | Admitting: *Deleted

## 2024-01-23 MED ORDER — OXCARBAZEPINE 300 MG PO TABS: 600.0000 mg | ORAL_TABLET | Freq: Three times a day (TID) | ORAL | 0 refills | Status: DC

## 2024-01-23 NOTE — Telephone Encounter (Signed)
 Last seen on 09/07/23 No follow up scheduled  [facility].

## 2024-05-01 ENCOUNTER — Telehealth: Payer: Self-pay | Admitting: Family Medicine

## 2024-05-01 ENCOUNTER — Other Ambulatory Visit: Payer: Self-pay

## 2024-05-01 MED ORDER — OXCARBAZEPINE 300 MG PO TABS: 600.0000 mg | ORAL_TABLET | Freq: Three times a day (TID) | ORAL | 0 refills | Status: AC

## 2024-05-01 NOTE — Telephone Encounter (Signed)
 Pt called to request refill of medication Oxcarbazepine  (TRILEPTAL ) 300 MG tablet   Pt medication is to be sent to   [id number] - [place], [place] - [address] Phone: [phone]  Fax: [phone]

## 2024-05-01 NOTE — Telephone Encounter (Signed)
 Sent script to pharmacy.
